# Patient Record
Sex: Female | Born: 1955
Health system: Southern US, Community
[De-identification: ages and names within clinical notes are randomized; demographics above are authoritative.]

## PROBLEM LIST (undated history)

## (undated) HISTORY — PX: OTHER SURGICAL HISTORY: SHX169

---

## 1977-03-28 HISTORY — PX: KNEE SURGERY: SHX244

## 1991-03-29 HISTORY — PX: NECK SURGERY: SHX720

## 1998-09-23 ENCOUNTER — Other Ambulatory Visit: Admission: RE | Admit: 1998-09-23 | Discharge: 1998-09-23 | Payer: Self-pay | Admitting: Obstetrics & Gynecology

## 1999-12-13 ENCOUNTER — Other Ambulatory Visit: Admission: RE | Admit: 1999-12-13 | Discharge: 1999-12-13 | Payer: Self-pay | Admitting: Obstetrics and Gynecology

## 2001-02-05 ENCOUNTER — Other Ambulatory Visit: Admission: RE | Admit: 2001-02-05 | Discharge: 2001-02-05 | Payer: Self-pay | Admitting: Obstetrics and Gynecology

## 2001-12-11 ENCOUNTER — Encounter: Payer: Self-pay | Admitting: Obstetrics and Gynecology

## 2001-12-11 ENCOUNTER — Encounter: Admission: RE | Admit: 2001-12-11 | Discharge: 2001-12-11 | Payer: Self-pay | Admitting: Obstetrics and Gynecology

## 2002-05-08 ENCOUNTER — Other Ambulatory Visit: Admission: RE | Admit: 2002-05-08 | Discharge: 2002-05-08 | Payer: Self-pay | Admitting: Obstetrics and Gynecology

## 2003-05-12 ENCOUNTER — Other Ambulatory Visit: Admission: RE | Admit: 2003-05-12 | Discharge: 2003-05-12 | Payer: Self-pay | Admitting: Obstetrics and Gynecology

## 2004-06-11 ENCOUNTER — Other Ambulatory Visit: Admission: RE | Admit: 2004-06-11 | Discharge: 2004-06-11 | Payer: Self-pay | Admitting: Obstetrics and Gynecology

## 2017-09-29 LAB — HM MAMMOGRAPHY

## 2017-09-29 LAB — HM PAP SMEAR: HM Pap smear: NEGATIVE

## 2018-05-25 ENCOUNTER — Other Ambulatory Visit (HOSPITAL_COMMUNITY): Payer: Self-pay | Admitting: Orthopedic Surgery

## 2018-05-25 ENCOUNTER — Other Ambulatory Visit: Payer: Self-pay | Admitting: Orthopedic Surgery

## 2018-05-25 DIAGNOSIS — M25532 Pain in left wrist: Secondary | ICD-10-CM

## 2018-06-08 ENCOUNTER — Ambulatory Visit (HOSPITAL_COMMUNITY)
Admission: RE | Admit: 2018-06-08 | Discharge: 2018-06-08 | Disposition: A | Discharge status: Home / Self Care | Payer: Commercial Managed Care - PPO | Source: Ambulatory Visit | Attending: Orthopedic Surgery | Admitting: Orthopedic Surgery

## 2018-06-08 ENCOUNTER — Other Ambulatory Visit: Payer: Self-pay

## 2018-06-08 DIAGNOSIS — M25532 Pain in left wrist: Secondary | ICD-10-CM

## 2018-06-08 MED ORDER — IOPAMIDOL (ISOVUE-M 200) INJECTION 41%
10.0000 mL | Freq: Once | INTRAMUSCULAR | Status: AC
  Administered 2018-06-08: 10 mL via INTRA_ARTICULAR

## 2018-06-08 MED ORDER — SODIUM CHLORIDE (PF) 0.9 % IJ SOLN
10.0000 mL | Freq: Once | INTRAMUSCULAR | Status: AC
  Administered 2018-06-08: 10 mL

## 2018-06-08 MED ORDER — LIDOCAINE HCL (PF) 1 % IJ SOLN
5.0000 mL | Freq: Once | INTRAMUSCULAR | Status: AC
  Administered 2018-06-08: 5 mL via INTRADERMAL

## 2018-06-08 MED ORDER — GADOBENATE DIMEGLUMINE 529 MG/ML IV SOLN
0.1000 mL | Freq: Once | INTRAVENOUS | Status: AC | PRN
  Administered 2018-06-08: 0.1 mL via INTRA_ARTICULAR

## 2018-07-27 HISTORY — PX: WRIST SURGERY: SHX841

## 2018-10-31 ENCOUNTER — Encounter: Payer: Self-pay | Admitting: Nurse Practitioner

## 2018-10-31 ENCOUNTER — Ambulatory Visit (INDEPENDENT_AMBULATORY_CARE_PROVIDER_SITE_OTHER): Payer: Commercial Managed Care - PPO | Admitting: Nurse Practitioner

## 2018-10-31 ENCOUNTER — Other Ambulatory Visit: Payer: Self-pay

## 2018-10-31 VITALS — BP 130/86 | HR 68 | Temp 98.4°F | Ht 67.25 in | Wt 185.8 lb

## 2018-10-31 DIAGNOSIS — Z13220 Encounter for screening for lipoid disorders: Secondary | ICD-10-CM

## 2018-10-31 DIAGNOSIS — Z8249 Family history of ischemic heart disease and other diseases of the circulatory system: Secondary | ICD-10-CM | POA: Insufficient documentation

## 2018-10-31 DIAGNOSIS — Z0000 Encounter for general adult medical examination without abnormal findings: Secondary | ICD-10-CM | POA: Diagnosis not present

## 2018-10-31 DIAGNOSIS — Z136 Encounter for screening for cardiovascular disorders: Secondary | ICD-10-CM | POA: Diagnosis not present

## 2018-10-31 LAB — COMPREHENSIVE METABOLIC PANEL
ALT: 15 U/L (ref 0–35)
AST: 15 U/L (ref 0–37)
Albumin: 4.4 g/dL (ref 3.5–5.2)
Alkaline Phosphatase: 87 U/L (ref 39–117)
BUN: 15 mg/dL (ref 6–23)
CO2: 29 mEq/L (ref 19–32)
Calcium: 9.9 mg/dL (ref 8.4–10.5)
Chloride: 107 mEq/L (ref 96–112)
Creatinine, Ser: 0.64 mg/dL (ref 0.40–1.20)
GFR: 93.55 mL/min (ref >60.00)
Glucose, Bld: 89 mg/dL (ref 70–99)
Potassium: 4.9 mEq/L (ref 3.5–5.1)
Sodium: 142 mEq/L (ref 135–145)
Total Bilirubin: 0.9 mg/dL (ref 0.2–1.2)
Total Protein: 7.2 g/dL (ref 6.0–8.3)

## 2018-10-31 LAB — LIPID PANEL
Cholesterol: 184 mg/dL (ref 0–200)
HDL: 64.4 mg/dL (ref >39.00)
LDL Cholesterol: 109 mg/dL — ABNORMAL HIGH (ref 0–99)
NonHDL: 119.51
Total CHOL/HDL Ratio: 3
Triglycerides: 52 mg/dL (ref 0.0–149.0)
VLDL: 10.4 mg/dL (ref 0.0–40.0)

## 2018-10-31 LAB — CBC WITH DIFFERENTIAL/PLATELET
Basophils Absolute: 0.1 10*3/uL (ref 0.0–0.1)
Basophils Relative: 1.3 % (ref 0.0–3.0)
Eosinophils Absolute: 0.1 10*3/uL (ref 0.0–0.7)
Eosinophils Relative: 1.5 % (ref 0.0–5.0)
HCT: 42.2 % (ref 36.0–46.0)
Hemoglobin: 14 g/dL (ref 12.0–15.0)
Lymphocytes Relative: 36.5 % (ref 12.0–46.0)
Lymphs Abs: 2.1 10*3/uL (ref 0.7–4.0)
MCHC: 33.3 g/dL (ref 30.0–36.0)
MCV: 95.3 fl (ref 78.0–100.0)
Monocytes Absolute: 0.5 10*3/uL (ref 0.1–1.0)
Monocytes Relative: 8.8 % (ref 3.0–12.0)
Neutro Abs: 3 10*3/uL (ref 1.4–7.7)
Neutrophils Relative %: 51.9 % (ref 43.0–77.0)
Platelets: 310 10*3/uL (ref 150.0–400.0)
RBC: 4.43 Mil/uL (ref 3.87–5.11)
RDW: 13 % (ref 11.5–15.5)
WBC: 5.7 10*3/uL (ref 4.0–10.5)

## 2018-10-31 LAB — TSH: TSH: 1.27 u[IU]/mL (ref 0.35–4.50)

## 2018-10-31 NOTE — Progress Notes (Signed)
Subjective:    Patient ID: Danielle Webster Webster, female    DOB: December 30, 1955, 63 y.o.   MRN: [id number]  Patient presents today for complete physical and establish care (new patient)  HPI  Denies any acute complaints.  Sexual History (orientation,birth control, marital status, STD):GYN: Dr. Runell Gess with [facility]. Last seen 2019 PAP: no  Hx of abnormal PAP per patient Mammogram: last 2019 normal per patient  Depression/Suicide: Depression screen [facility] 2/9 10/31/2018  Decreased Interest 0  Down, Depressed, Hopeless 0  PHQ - 2 Score 0   Vision:up to date  Dental: up to date   Immunizations: (TDAP, Hep C screen, Pneumovax, Influenza, zoster)  Health Maintenance  Topic Date Due  . Pap Smear  04/04/1976  . Mammogram  04/04/2005  . Colon Cancer Screening  04/04/2005  . Flu Shot  10/27/2018  . Tetanus Vaccine  10/31/2019*  .  Hepatitis C: One time screening is recommended by Center for Disease Control  (CDC) for  adults born from [birth year] through 1965.   10/31/2019*  . HIV Screening  10/31/2019*  *Topic was postponed. The date shown is not the original due date.   Diet:regular  Weight:  Wt Readings from Last 3 Encounters:  10/31/18 185 lb 12.8 oz (84.3 kg)   Exercise:none  Fall Risk: Fall Risk  10/31/2018  Falls in the past year? 0   Advanced Directive: Advanced Directives 10/31/2018  Does Patient Have a Medical Advance Directive? No  Would patient like information on creating a medical advance directive? Yes (MAU/Ambulatory/Procedural Areas - Information given)     Medications and allergies reviewed with patient and updated if appropriate.  Patient Active Problem List   Diagnosis Date Noted  . Family history of premature CAD 10/31/2018    No current outpatient medications on file prior to visit.   No current facility-administered medications on file prior to visit.     History reviewed. No pertinent past medical history.  Past Surgical History:  Procedure  Laterality Date  . KNEE SURGERY Right 1979  . [place]   ruptureed disc on neck  . WRIST SURGERY Left 07/2018    Social History   Socioeconomic History  . Marital status: Divorced    Spouse name: Not on file  . Number of children: Not on file  . Years of education: Not on file  . Highest education level: Not on file  Occupational History    Comment: Scientist, water quality  Social Needs  . Financial resource strain: Not on file  . Food insecurity    Worry: Not on file    Inability: Not on file  . Transportation needs    Medical: Not on file    Non-medical: Not on file  Tobacco Use  . Smoking status: Never Smoker  . Smokeless tobacco: Never Used  Substance and Sexual Activity  . Alcohol use: Yes    Comment: social  . Drug use: Never  . Sexual activity: Not Currently  Lifestyle  . Physical activity    Days per week: Not on file    Minutes per session: Not on file  . Stress: Not on file  Relationships  . Social Herbalist on phone: Not on file    Gets together: Not on file    Attends religious service: Not on file    Active member of club or organization: Not on file    Attends meetings of clubs or organizations: Not on file  Relationship status: Not on file  Other Topics Concern  . Not on file  Social History Narrative  . Not on file    Family History  Problem Relation Age of Onset  . Heart disease Mother 783       enlarged heart  . Cancer Father 6158       lung cancer secondary to tobacco use  . AAA (abdominal aortic aneurysm) Father   . Heart disease Father [id number]       CAD with CABG  . Diabetes Maternal Grandfather   . Cancer Paternal Grandfather        Review of Systems  Constitutional: Negative for fever, malaise/fatigue and weight loss.  HENT: Negative for congestion and sore throat.   Eyes:       Negative for visual changes  Respiratory: Negative for cough and shortness of breath.   Cardiovascular: Negative for chest pain, palpitations  and leg swelling.  Gastrointestinal: Negative for blood in stool, constipation, diarrhea and heartburn.  Genitourinary: Negative for dysuria, frequency and urgency.  Musculoskeletal: Negative for falls, joint pain and myalgias.  Skin: Negative for rash.  Neurological: Negative for dizziness, sensory change and headaches.  Endo/Heme/Allergies: Does not bruise/bleed easily.  Psychiatric/Behavioral: Negative for depression, substance abuse and suicidal ideas. The patient is not nervous/anxious.     Objective:   Vitals:   10/31/18 0841  BP: 130/86  Pulse: 68  Temp: 98.4 F (36.9 C)  SpO2: 98%    Body mass index is 28.88 kg/m.   Physical Examination:  Physical Exam Vitals signs reviewed.  Constitutional:      General: She is not in acute distress.    Appearance: She is well-developed.  HENT:     Head: Normocephalic.     Right Ear: Tympanic membrane, ear canal and external ear normal.     Left Ear: Tympanic membrane, ear canal and external ear normal.     Nose: Nose normal.     Mouth/Throat:     Pharynx: No oropharyngeal exudate.  Eyes:     Extraocular Movements: Extraocular movements intact.     Conjunctiva/sclera: Conjunctivae normal.  Cardiovascular:     Rate and Rhythm: Normal rate and regular rhythm.     Heart sounds: Normal heart sounds.  Pulmonary:     Effort: Pulmonary effort is normal. No respiratory distress.     Breath sounds: Normal breath sounds.  Chest:     Chest wall: No tenderness.  Abdominal:     General: Bowel sounds are normal.     Palpations: Abdomen is soft.  Genitourinary:    Comments: Deferred pelvic and breast exam to GYN Musculoskeletal: Normal range of motion.  Skin:    General: Skin is warm and dry.  Neurological:     Mental Status: She is alert and oriented to person, place, and time.     Motor: No weakness.     Gait: Gait normal.     Deep Tendon Reflexes: Reflexes are normal and symmetric.  Psychiatric:        Mood and Affect:  Mood normal.        Behavior: Behavior normal.    ASSESSMENT and PLAN:  Danielle Webster was seen today for establish care.  Diagnoses and all orders for this visit:  Preventative health care -     CBC with Differential/Platelet -     Comprehensive metabolic panel -     TSH -     Lipid panel  Encounter for lipid screening for cardiovascular disease -  Lipid panel   No problem-specific Assessment & Plan notes found for this encounter.     Problem List Items Addressed This Visit    None    Visit Diagnoses    Preventative health care    -  Primary   Relevant Orders   CBC with Differential/Platelet   Comprehensive metabolic panel   TSH   Lipid panel   Encounter for lipid screening for cardiovascular disease       Relevant Orders   Lipid panel       Follow up: Return if symptoms worsen or fail to improve.  Alysia Pennaharlotte Kalese Ensz, NP

## 2018-10-31 NOTE — Patient Instructions (Addendum)
Thank you for choosing [facility] for your health needs  Go to lab for blood draw.  We will request recent PAP and mammogram from Dr. Helane Rima  Let us know where last colonoscopy was done.  Health Maintenance, Female Adopting a healthy lifestyle and getting preventive care are important in promoting health and wellness. Ask your health care provider about:  The right schedule for you to have regular tests and exams.  Things you can do on your own to prevent diseases and keep yourself healthy. What should I know about diet, weight, and exercise? Eat a healthy diet   Eat a diet that includes plenty of vegetables, fruits, low-fat dairy products, and lean protein.  Do not eat a lot of foods that are high in solid fats, added sugars, or sodium. Maintain a healthy weight Body mass index (BMI) is used to identify weight problems. It estimates body fat based on height and weight. Your health care provider can help determine your BMI and help you achieve or maintain a healthy weight. Get regular exercise Get regular exercise. This is one of the most important things you can do for your health. Most adults should:  Exercise for at least 150 minutes each week. The exercise should increase your heart rate and make you sweat (moderate-intensity exercise).  Do strengthening exercises at least twice a week. This is in addition to the moderate-intensity exercise.  Spend less time sitting. Even light physical activity can be beneficial. Watch cholesterol and blood lipids Have your blood tested for lipids and cholesterol at 63 years of age, then have this test every 5 years. Have your cholesterol levels checked more often if:  Your lipid or cholesterol levels are high.  You are older than 63 years of age.  You are at high risk for heart disease. What should I know about cancer screening? Depending on your health history and family history, you may need to have cancer screening at various ages. This  may include screening for:  Breast cancer.  Cervical cancer.  Colorectal cancer.  Skin cancer.  Lung cancer. What should I know about heart disease, diabetes, and high blood pressure? Blood pressure and heart disease  High blood pressure causes heart disease and increases the risk of stroke. This is more likely to develop in people who have high blood pressure readings, are of African descent, or are overweight.  Have your blood pressure checked: ? Every 3-5 years if you are 99-49 years of age. ? Every year if you are 13 years old or older. Diabetes Have regular diabetes screenings. This checks your fasting blood sugar level. Have the screening done:  Once every three years after age 22 if you are at a normal weight and have a low risk for diabetes.  More often and at a younger age if you are overweight or have a high risk for diabetes. What should I know about preventing infection? Hepatitis B If you have a higher risk for hepatitis B, you should be screened for this virus. Talk with your health care provider to find out if you are at risk for hepatitis B infection. Hepatitis C Testing is recommended for:  Everyone born from [birth year] through 1965.  Anyone with known risk factors for hepatitis C. Sexually transmitted infections (STIs)  Get screened for STIs, including gonorrhea and chlamydia, if: ? You are sexually active and are younger than 63 years of age. ? You are older than 63 years of age and your health care provider tells you  that you are at risk for this type of infection. ? Your sexual activity has changed since you were last screened, and you are at increased risk for chlamydia or gonorrhea. Ask your health care provider if you are at risk.  Ask your health care provider about whether you are at high risk for HIV. Your health care provider may recommend a prescription medicine to help prevent HIV infection. If you choose to take medicine to prevent HIV, you should  first get tested for HIV. You should then be tested every 3 months for as long as you are taking the medicine. Pregnancy  If you are about to stop having your period (premenopausal) and you may become pregnant, seek counseling before you get pregnant.  Take 400 to 800 micrograms (mcg) of folic acid every day if you become pregnant.  Ask for birth control (contraception) if you want to prevent pregnancy. Osteoporosis and menopause Osteoporosis is a disease in which the bones lose minerals and strength with aging. This can result in bone fractures. If you are 21 years old or older, or if you are at risk for osteoporosis and fractures, ask your health care provider if you should:  Be screened for bone loss.  Take a calcium or vitamin D supplement to lower your risk of fractures.  Be given hormone replacement therapy (HRT) to treat symptoms of menopause. Follow these instructions at home: Lifestyle  Do not use any products that contain nicotine or tobacco, such as cigarettes, e-cigarettes, and chewing tobacco. If you need help quitting, ask your health care provider.  Do not use street drugs.  Do not share needles.  Ask your health care provider for help if you need support or information about quitting drugs. Alcohol use  Do not drink alcohol if: ? Your health care provider tells you not to drink. ? You are pregnant, may be pregnant, or are planning to become pregnant.  If you drink alcohol: ? Limit how much you use to 0-1 drink a day. ? Limit intake if you are breastfeeding.  Be aware of how much alcohol is in your drink. In the [place] one drink equals one 12 oz bottle of beer (355 mL), one 5 oz glass of wine (148 mL), or one 1 oz glass of hard liquor (44 mL). General instructions  Schedule regular health, dental, and eye exams.  Stay current with your vaccines.  Tell your health care provider if: ? You often feel depressed. ? You have ever been abused or do not feel safe  at home. Summary  Adopting a healthy lifestyle and getting preventive care are important in promoting health and wellness.  Follow your health care provider's instructions about healthy diet, exercising, and getting tested or screened for diseases.  Follow your health care provider's instructions on monitoring your cholesterol and blood pressure. This information is not intended to replace advice given to you by your health care provider. Make sure you discuss any questions you have with your health care provider. Document Released: 09/27/2010 Document Revised: 03/07/2018 Document Reviewed: 03/07/2018 Elsevier Patient Education  2020 [employer].

## 2018-11-14 ENCOUNTER — Encounter: Payer: Self-pay | Admitting: Nurse Practitioner

## 2018-11-14 NOTE — Progress Notes (Signed)
Abstracted result and sent to scan  

## 2019-05-01 ENCOUNTER — Other Ambulatory Visit (HOSPITAL_COMMUNITY): Payer: Self-pay | Admitting: Orthopedic Surgery

## 2019-05-01 ENCOUNTER — Other Ambulatory Visit: Payer: Self-pay | Admitting: Orthopedic Surgery

## 2019-05-01 DIAGNOSIS — M25561 Pain in right knee: Secondary | ICD-10-CM

## 2019-05-09 ENCOUNTER — Other Ambulatory Visit: Payer: Self-pay

## 2019-05-09 ENCOUNTER — Ambulatory Visit (HOSPITAL_COMMUNITY)
Admission: RE | Admit: 2019-05-09 | Discharge: 2019-05-09 | Disposition: A | Discharge status: Home / Self Care | Payer: Commercial Managed Care - PPO | Source: Ambulatory Visit | Attending: Orthopedic Surgery | Admitting: Orthopedic Surgery

## 2019-05-09 DIAGNOSIS — M25561 Pain in right knee: Secondary | ICD-10-CM | POA: Diagnosis present

## 2019-08-14 ENCOUNTER — Encounter: Payer: Self-pay | Admitting: Nurse Practitioner

## 2019-11-14 ENCOUNTER — Other Ambulatory Visit: Payer: Self-pay

## 2019-11-15 ENCOUNTER — Ambulatory Visit (INDEPENDENT_AMBULATORY_CARE_PROVIDER_SITE_OTHER): Payer: Commercial Managed Care - PPO | Admitting: Nurse Practitioner

## 2019-11-15 ENCOUNTER — Encounter: Payer: Self-pay | Admitting: Nurse Practitioner

## 2019-11-15 VITALS — BP 118/84 | HR 69 | Temp 97.8°F | Ht 65.75 in | Wt 178.4 lb

## 2019-11-15 DIAGNOSIS — Z1159 Encounter for screening for other viral diseases: Secondary | ICD-10-CM | POA: Diagnosis not present

## 2019-11-15 DIAGNOSIS — Z136 Encounter for screening for cardiovascular disorders: Secondary | ICD-10-CM

## 2019-11-15 DIAGNOSIS — Z01818 Encounter for other preprocedural examination: Secondary | ICD-10-CM

## 2019-11-15 DIAGNOSIS — Z13220 Encounter for screening for lipoid disorders: Secondary | ICD-10-CM

## 2019-11-15 DIAGNOSIS — Z0000 Encounter for general adult medical examination without abnormal findings: Secondary | ICD-10-CM

## 2019-11-15 LAB — COMPREHENSIVE METABOLIC PANEL
ALT: 13 U/L (ref 0–35)
AST: 14 U/L (ref 0–37)
Albumin: 4.2 g/dL (ref 3.5–5.2)
Alkaline Phosphatase: 97 U/L (ref 39–117)
BUN: 17 mg/dL (ref 6–23)
CO2: 28 mEq/L (ref 19–32)
Calcium: 9.4 mg/dL (ref 8.4–10.5)
Chloride: 107 mEq/L (ref 96–112)
Creatinine, Ser: 0.66 mg/dL (ref 0.40–1.20)
GFR: 89.99 mL/min (ref >60.00)
Glucose, Bld: 84 mg/dL (ref 70–99)
Potassium: 4.3 mEq/L (ref 3.5–5.1)
Sodium: 142 mEq/L (ref 135–145)
Total Bilirubin: 0.9 mg/dL (ref 0.2–1.2)
Total Protein: 6.6 g/dL (ref 6.0–8.3)

## 2019-11-15 LAB — CBC WITH DIFFERENTIAL/PLATELET
Basophils Absolute: 0.1 10*3/uL (ref 0.0–0.1)
Basophils Relative: 1 % (ref 0.0–3.0)
Eosinophils Absolute: 0.1 10*3/uL (ref 0.0–0.7)
Eosinophils Relative: 1.2 % (ref 0.0–5.0)
HCT: 41.3 % (ref 36.0–46.0)
Hemoglobin: 14 g/dL (ref 12.0–15.0)
Lymphocytes Relative: 34.6 % (ref 12.0–46.0)
Lymphs Abs: 2.1 10*3/uL (ref 0.7–4.0)
MCHC: 33.8 g/dL (ref 30.0–36.0)
MCV: 94.5 fl (ref 78.0–100.0)
Monocytes Absolute: 0.4 10*3/uL (ref 0.1–1.0)
Monocytes Relative: 7 % (ref 3.0–12.0)
Neutro Abs: 3.4 10*3/uL (ref 1.4–7.7)
Neutrophils Relative %: 56.2 % (ref 43.0–77.0)
Platelets: 275 10*3/uL (ref 150.0–400.0)
RBC: 4.38 Mil/uL (ref 3.87–5.11)
RDW: 13.1 % (ref 11.5–15.5)
WBC: 6 10*3/uL (ref 4.0–10.5)

## 2019-11-15 LAB — TSH: TSH: 1.18 u[IU]/mL (ref 0.35–4.50)

## 2019-11-15 LAB — LIPID PANEL
Cholesterol: 177 mg/dL (ref 0–200)
HDL: 58 mg/dL (ref >39.00)
LDL Cholesterol: 108 mg/dL — ABNORMAL HIGH (ref 0–99)
NonHDL: 119.46
Total CHOL/HDL Ratio: 3
Triglycerides: 55 mg/dL (ref 0.0–149.0)
VLDL: 11 mg/dL (ref 0.0–40.0)

## 2019-11-15 LAB — HEMOGLOBIN A1C: Hgb A1c MFr Bld: 5.6 % (ref 4.6–6.5)

## 2019-11-15 NOTE — Progress Notes (Signed)
Subjective:    Patient ID: Danielle Webster, female    DOB: 1955/11/30, 64 y.o.   MRN: [id number]  Patient presents today for CPE and preop clearance for right knee total arthroplasty.  HPI She does not have an exact date for surgery. She thinks it will be sometime in October,2021. She has had previous surgeries with use of general anesthesia. Denies any hx of complications. She has no hx of PE/DVT. No FHx of anesthesia complication or PE/DVT.  Sexual History (orientation,birth control, marital status, STD):single, not sexually active, pelvic and breast exam completed vy Dr. Vincente Poli per patient. Has upcoming mammogram 11/2019 per patient. Up to date with PAP.  Depression/Suicide: Depression screen [facility] 2/9 11/15/2019 10/31/2018  Decreased Interest 0 0  Down, Depressed, Hopeless 0 0  PHQ - 2 Score 0 0   Vision:up to date  Dental:up to date  Immunizations: (TDAP, Hep C screen, Pneumovax, Influenza, zoster)  Health Maintenance  Topic Date Due  .  Hepatitis C: One time screening is recommended by Center for Disease Control  (CDC) for  adults born from [birth year] through 1965.   Never done  . HIV Screening  Never done  . Tetanus Vaccine  Never done  . Mammogram  09/30/2019  . Flu Shot  10/27/2019  . Pap Smear  09/29/2020  . Colon Cancer Screening  11/23/2023  . COVID-19 Vaccine  Completed   Diet:regular. Exercise: limited due to right knee pain.  Weight:  Wt Readings from Last 3 Encounters:  11/15/19 178 lb 6.4 oz (80.9 kg)  10/31/18 185 lb 12.8 oz (84.3 kg)   Fall Risk: Fall Risk  10/31/2018  Falls in the past year? 0   Advanced Directive: Advanced Directives 10/31/2018  Does Patient Have a Medical Advance Directive? No  Would patient like information on creating a medical advance directive? Yes (MAU/Ambulatory/Procedural Areas - Information given)    Medications and allergies reviewed with patient and updated if appropriate.  Patient Active Problem List   Diagnosis Date Noted  .  Family history of premature CAD 10/31/2018   Current Outpatient Medications on File Prior to Visit  Medication Sig Dispense Refill  . meloxicam (MOBIC) 15 MG tablet Take 15 mg by mouth daily.     No current facility-administered medications on file prior to visit.   History reviewed. No pertinent past medical history.  Past Surgical History:  Procedure Laterality Date  . KNEE SURGERY Right 1979  . NECK SURGERY  1993   ruptureed disc on neck  . WRIST SURGERY Left 07/2018    Social History   Socioeconomic History  . Marital status: Divorced    Spouse name: Not on file  . Number of children: Not on file  . Years of education: Not on file  . Highest education level: Not on file  Occupational History    Comment: Leisure centre manager  Tobacco Use  . Smoking status: Never Smoker  . Smokeless tobacco: Never Used  Vaping Use  . Vaping Use: Never used  Substance and Sexual Activity  . Alcohol use: Yes    Comment: social  . Drug use: Never  . Sexual activity: Not Currently  Other Topics Concern  . Not on file  Social History Narrative  . Not on file   Social Determinants of Health   Financial Resource Strain:   . Difficulty of Paying Living Expenses: Not on file  Food Insecurity:   . Worried About Programme researcher, broadcasting/film/video in the Last Year: Not on file  .  Ran Out of Food in the Last Year: Not on file  Transportation Needs:   . Lack of Transportation (Medical): Not on file  . Lack of Transportation (Non-Medical): Not on file  Physical Activity:   . Days of Exercise per Week: Not on file  . Minutes of Exercise per Session: Not on file  Stress:   . Feeling of Stress : Not on file  Social Connections:   . Frequency of Communication with Friends and Family: Not on file  . Frequency of Social Gatherings with Friends and Family: Not on file  . Attends Religious Services: Not on file  . Active Member of Clubs or Organizations: Not on file  . Attends Banker Meetings: Not  on file  . Marital Status: Not on file   Family History  Problem Relation Age of Onset  . Heart disease Mother 9       enlarged heart  . Cancer Father 30       lung cancer secondary to tobacco use  . AAA (abdominal aortic aneurysm) Father   . Heart disease Father 85       CAD with CABG  . Diabetes Maternal Grandfather   . Cancer Paternal Grandfather        Review of Systems  Constitutional: Negative for fever, malaise/fatigue and weight loss.  HENT: Negative for congestion and sore throat.   Eyes:       Negative for visual changes  Respiratory: Negative for cough and shortness of breath.   Cardiovascular: Negative for chest pain, palpitations and leg swelling.  Gastrointestinal: Negative for blood in stool, constipation, diarrhea and heartburn.  Genitourinary: Negative for dysuria, frequency and urgency.  Musculoskeletal: Negative for falls, joint pain and myalgias.  Skin: Negative for rash.  Neurological: Negative for dizziness, sensory change and headaches.  Endo/Heme/Allergies: Does not bruise/bleed easily.  Psychiatric/Behavioral: Negative for depression, substance abuse and suicidal ideas. The patient is not nervous/anxious.    Objective:   Vitals:   11/15/19 0850  BP: 118/84  Pulse: 69  Temp: 97.8 F (36.6 C)  SpO2: 100%   Body mass index is 29.01 kg/m.  ECG: NSR with bifurcated P-wave and RBBB, no previous tracing to compare.  Physical Examination:  Physical Exam Vitals reviewed.  Constitutional:      General: She is not in acute distress.    Appearance: She is well-developed.  HENT:     Right Ear: Tympanic membrane, ear canal and external ear normal.     Left Ear: Tympanic membrane, ear canal and external ear normal.  Eyes:     Extraocular Movements: Extraocular movements intact.     Conjunctiva/sclera: Conjunctivae normal.  Cardiovascular:     Rate and Rhythm: Normal rate and regular rhythm.     Pulses: Normal pulses.     Heart sounds: Normal  heart sounds.  Pulmonary:     Effort: Pulmonary effort is normal. No respiratory distress.     Breath sounds: Normal breath sounds.  Chest:     Chest wall: No tenderness.  Abdominal:     General: Bowel sounds are normal. There is no distension.     Palpations: Abdomen is soft.     Tenderness: There is no abdominal tenderness.  Genitourinary:    Comments: Breast and pelvic exam deferred to GYN per patient Musculoskeletal:        General: Normal range of motion.     Cervical back: Normal range of motion and neck supple.     Right  lower leg: No edema.     Left lower leg: No edema.  Lymphadenopathy:     Cervical: No cervical adenopathy.  Skin:    General: Skin is warm and dry.     Findings: No erythema or rash.  Neurological:     Mental Status: She is alert and oriented to person, place, and time.     Deep Tendon Reflexes: Reflexes are normal and symmetric.  Psychiatric:        Mood and Affect: Mood normal.        Behavior: Behavior normal.        Thought Content: Thought content normal.    ASSESSMENT and PLAN: This visit occurred during the SARS-CoV-2 public health emergency.  Safety protocols were in place, including screening questions prior to the visit, additional usage of staff PPE, and extensive cleaning of exam room while observing appropriate contact time as indicated for disinfecting solutions.   Ndia was seen today for annual exam.  Diagnoses and all orders for this visit:  Preventative health care -     CBC with Differential/Platelet -     Comprehensive metabolic panel -     Lipid panel -     TSH  Preoperative clearance -     EKG 12-Lead -     Hemoglobin A1c  Encounter for lipid screening for cardiovascular disease -     Lipid panel  Encounter for hepatitis C screening test for low risk patient      Problem List Items Addressed This Visit    None    Visit Diagnoses    Preventative health care    -  Primary   Relevant Orders   CBC with  Differential/Platelet   Comprehensive metabolic panel   Lipid panel   TSH   Preoperative clearance       Relevant Orders   EKG 12-Lead   Hemoglobin A1c   Encounter for lipid screening for cardiovascular disease       Relevant Orders   Lipid panel   Encounter for hepatitis C screening test for low risk patient          Follow up: Return in about 1 year (around 11/14/2020) for CPE (fasting).  Alysia Penna, NP

## 2019-11-15 NOTE — Patient Instructions (Addendum)
Have preop form faxed to me Wish you the best with surgery and recovery.  Go to lab for blood draw. Have upcoming mammogram results faxed to me.  No acute finding on ECG.  Preventive Care 84-64 Years Old, Female Preventive care refers to visits with your health care provider and lifestyle choices that can promote health and wellness. This includes:  A yearly physical exam. This may also be called an annual well check.  Regular dental visits and eye exams.  Immunizations.  Screening for certain conditions.  Healthy lifestyle choices, such as eating a healthy diet, getting regular exercise, not using drugs or products that contain nicotine and tobacco, and limiting alcohol use. What can I expect for my preventive care visit? Physical exam Your health care provider will check your:  Height and weight. This may be used to calculate body mass index (BMI), which tells if you are at a healthy weight.  Heart rate and blood pressure.  Skin for abnormal spots. Counseling Your health care provider may ask you questions about your:  Alcohol, tobacco, and drug use.  Emotional well-being.  Home and relationship well-being.  Sexual activity.  Eating habits.  Work and work Statistician.  Method of birth control.  Menstrual cycle.  Pregnancy history. What immunizations do I need?  Influenza (flu) vaccine  This is recommended every year. Tetanus, diphtheria, and pertussis (Tdap) vaccine  You may need a Td booster every 10 years. Varicella (chickenpox) vaccine  You may need this if you have not been vaccinated. Zoster (shingles) vaccine  You may need this after age 20. Measles, mumps, and rubella (MMR) vaccine  You may need at least one dose of MMR if you were born in 1957 or later. You may also need a second dose. Pneumococcal conjugate (PCV13) vaccine  You may need this if you have certain conditions and were not previously vaccinated. Pneumococcal polysaccharide  (PPSV23) vaccine  You may need one or two doses if you smoke cigarettes or if you have certain conditions. Meningococcal conjugate (MenACWY) vaccine  You may need this if you have certain conditions. Hepatitis A vaccine  You may need this if you have certain conditions or if you travel or work in places where you may be exposed to hepatitis A. Hepatitis B vaccine  You may need this if you have certain conditions or if you travel or work in places where you may be exposed to hepatitis B. Haemophilus influenzae type b (Hib) vaccine  You may need this if you have certain conditions. Human papillomavirus (HPV) vaccine  If recommended by your health care provider, you may need three doses over 6 months. You may receive vaccines as individual doses or as more than one vaccine together in one shot (combination vaccines). Talk with your health care provider about the risks and benefits of combination vaccines. What tests do I need? Blood tests  Lipid and cholesterol levels. These may be checked every 5 years, or more frequently if you are over 12 years old.  Hepatitis C test.  Hepatitis B test. Screening  Lung cancer screening. You may have this screening every year starting at age 40 if you have a 30-pack-year history of smoking and currently smoke or have quit within the past 15 years.  Colorectal cancer screening. All adults should have this screening starting at age 80 and continuing until age 59. Your health care provider may recommend screening at age 67 if you are at increased risk. You will have tests every 1-10 years,  depending on your results and the type of screening test.  Diabetes screening. This is done by checking your blood sugar (glucose) after you have not eaten for a while (fasting). You may have this done every 1-3 years.  Mammogram. This may be done every 1-2 years. Talk with your health care provider about when you should start having regular mammograms. This may  depend on whether you have a family history of breast cancer.  BRCA-related cancer screening. This may be done if you have a family history of breast, ovarian, tubal, or peritoneal cancers.  Pelvic exam and Pap test. This may be done every 3 years starting at age 20. Starting at age 57, this may be done every 5 years if you have a Pap test in combination with an HPV test. Other tests  Sexually transmitted disease (STD) testing.  Bone density scan. This is done to screen for osteoporosis. You may have this scan if you are at high risk for osteoporosis. Follow these instructions at home: Eating and drinking  Eat a diet that includes fresh fruits and vegetables, whole grains, lean protein, and low-fat dairy.  Take vitamin and mineral supplements as recommended by your health care provider.  Do not drink alcohol if: ? Your health care provider tells you not to drink. ? You are pregnant, may be pregnant, or are planning to become pregnant.  If you drink alcohol: ? Limit how much you have to 0-1 drink a day. ? Be aware of how much alcohol is in your drink. In the [place] one drink equals one 12 oz bottle of beer (355 mL), one 5 oz glass of wine (148 mL), or one 1 oz glass of hard liquor (44 mL). Lifestyle  Take daily care of your teeth and gums.  Stay active. Exercise for at least 30 minutes on 5 or more days each week.  Do not use any products that contain nicotine or tobacco, such as cigarettes, e-cigarettes, and chewing tobacco. If you need help quitting, ask your health care provider.  If you are sexually active, practice safe sex. Use a condom or other form of birth control (contraception) in order to prevent pregnancy and STIs (sexually transmitted infections).  If told by your health care provider, take low-dose aspirin daily starting at age 50. What's next?  Visit your health care provider once a year for a well check visit.  Ask your health care provider how often you should  have your eyes and teeth checked.  Stay up to date on all vaccines. This information is not intended to replace advice given to you by your health care provider. Make sure you discuss any questions you have with your health care provider. Document Revised: 11/23/2017 Document Reviewed: 11/23/2017 Elsevier Patient Education  2020 [employer].

## 2019-12-11 ENCOUNTER — Telehealth: Payer: Self-pay | Admitting: Nurse Practitioner

## 2019-12-11 NOTE — Telephone Encounter (Signed)
Forms placed in folder on [place] desk for review.

## 2019-12-11 NOTE — Telephone Encounter (Signed)
Notes faxed over to [facility].

## 2019-12-11 NOTE — Telephone Encounter (Signed)
Rebecaa is calling and wanted to see if patients recent labs and office notes be faxed to [phone], please advise. CB is [phone]

## 2019-12-11 NOTE — Telephone Encounter (Signed)
Patient dropped off forms to be completed by her provider. Placed forms in providers folder for pick up.

## 2019-12-12 LAB — HM MAMMOGRAPHY

## 2019-12-16 ENCOUNTER — Telehealth: Payer: Self-pay | Admitting: Nurse Practitioner

## 2019-12-16 NOTE — Telephone Encounter (Signed)
Unable to leave a message, phone just rings.

## 2019-12-16 NOTE — Telephone Encounter (Signed)
Danielle Webster is calling and wanted to see what recent labs patients has received, please advise. CB is [phone]

## 2019-12-18 ENCOUNTER — Telehealth: Payer: Self-pay | Admitting: Nurse Practitioner

## 2019-12-18 DIAGNOSIS — Z01818 Encounter for other preprocedural examination: Secondary | ICD-10-CM

## 2019-12-18 NOTE — Telephone Encounter (Signed)
Patient called back to check status of orders for PT INR. She will be out of town next week and so needs this done this week.

## 2019-12-18 NOTE — Telephone Encounter (Signed)
Please advise 

## 2019-12-18 NOTE — Telephone Encounter (Signed)
Ok to enter order

## 2019-12-18 NOTE — Telephone Encounter (Signed)
Patient is calling and wanted to see if Danielle Webster could put in orders to get PT INR labs checked for knee surgery, please advise. CB is [phone]

## 2019-12-19 ENCOUNTER — Other Ambulatory Visit: Payer: Self-pay

## 2019-12-19 ENCOUNTER — Other Ambulatory Visit (INDEPENDENT_AMBULATORY_CARE_PROVIDER_SITE_OTHER): Payer: Commercial Managed Care - PPO

## 2019-12-19 ENCOUNTER — Ambulatory Visit (INDEPENDENT_AMBULATORY_CARE_PROVIDER_SITE_OTHER): Payer: Commercial Managed Care - PPO

## 2019-12-19 DIAGNOSIS — Z01818 Encounter for other preprocedural examination: Secondary | ICD-10-CM

## 2019-12-19 DIAGNOSIS — Z01812 Encounter for preprocedural laboratory examination: Secondary | ICD-10-CM | POA: Diagnosis not present

## 2019-12-19 LAB — POCT INR: INR: 0.9 — AB (ref 2.0–3.0)

## 2019-12-19 LAB — PROTIME-INR
INR: 0.9 ratio (ref 0.8–1.0)
Prothrombin Time: 10.5 s (ref 9.6–13.1)

## 2019-12-19 NOTE — Addendum Note (Signed)
Addended by: Varney Biles on: 12/19/2019 01:26 PM   Modules accepted: Orders

## 2019-12-19 NOTE — Progress Notes (Signed)
Error. Patient was ordered PT/INR not a POCT INR. No billing required for this encounter.

## 2019-12-19 NOTE — Progress Notes (Signed)
Pt here for pre-op INR per [facility], AGNP.   Goal INR = TBD by surgeon  Pt denies recent antibiotics, no dietary changes and no unusual bruising / bleeding.  INR today = 0.9  Pt advised to await instructions from PCP and/or surgeon.

## 2019-12-21 ENCOUNTER — Encounter: Payer: Self-pay | Admitting: Nurse Practitioner

## 2019-12-23 ENCOUNTER — Telehealth: Payer: Self-pay | Admitting: Nurse Practitioner

## 2019-12-23 NOTE — Telephone Encounter (Signed)
Results have been faxed

## 2019-12-23 NOTE — Telephone Encounter (Signed)
Danielle Webster is calling and wanted to see if patients Ronnald Nian can be faxed to  [phone] for surgery. CB is [phone]

## 2020-01-03 HISTORY — PX: JOINT REPLACEMENT: SHX530

## 2020-07-22 IMAGING — MR MR KNEE*R* W/O CM
7 series · 38 of 40 positions shown · non-contrast
Comparison: None.

CLINICAL DATA: Chronic right knee pain.

EXAM:
MRI OF THE RIGHT KNEE WITHOUT CONTRAST
TECHNIQUE: Multiplanar, multisequence MR imaging of the knee was performed. No
intravenous contrast was administered.

[Series 5: PD fat-sat · sagittal · right · 4.0mm · 0.47mm/px · 6 of 22 slices shown (1 of 2)]
[im 1/22]
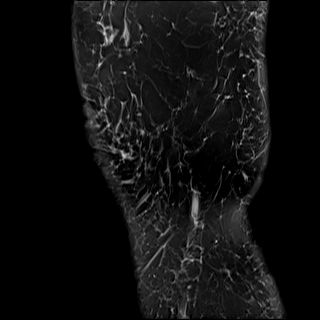
[im 5/22]
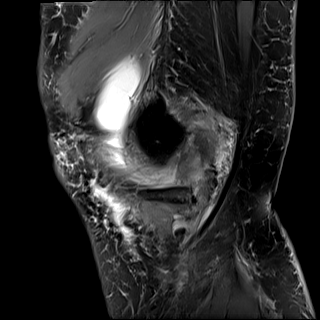
[im 9/22]
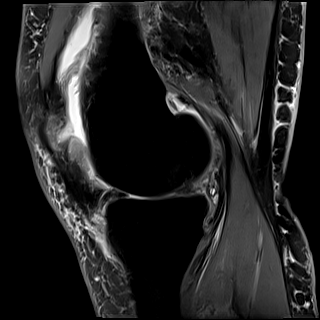
[im 13/22]
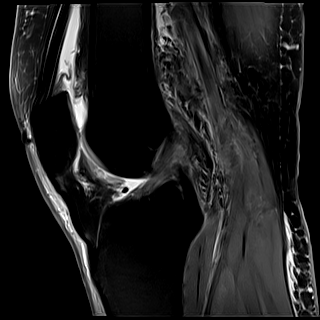
[im 17/22]
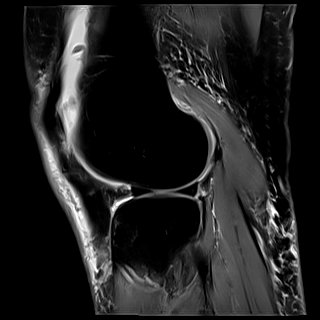
[im 22/22]
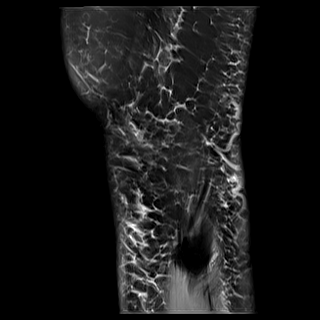

[Series 6: T2 fat-sat · sagittal · right · 4.0mm · 0.47mm/px · 5 of 22 slices shown (1 of 3)]
[im 1/22]
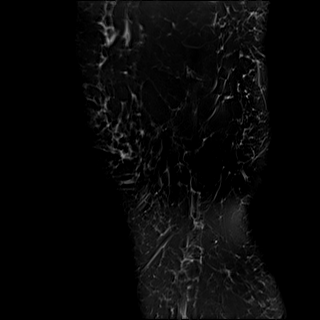
[im 6/22]
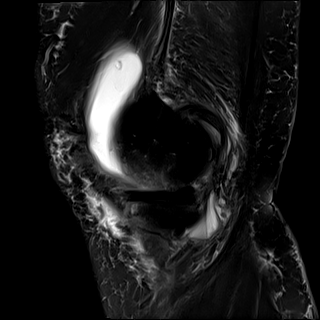
[im 11/22]
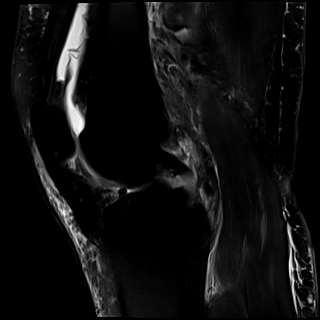
[im 16/22]
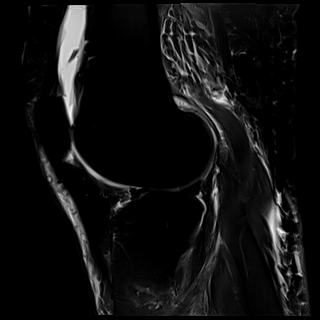
[im 22/22]
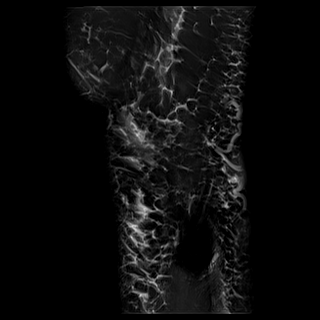

[Series 7: T2 fat-sat · axial · right · 4.0mm · 0.50mm/px · z∈[-74,+66]mm · 8 of 33 slices shown (2 of 3)]
[im 1/33]
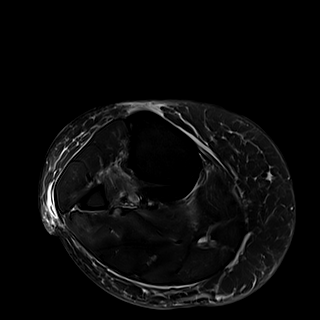
[im 5/33]
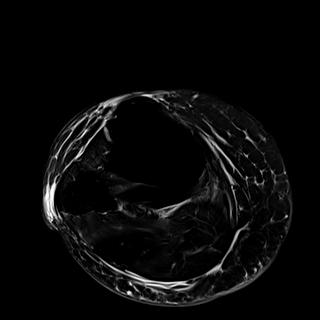
[im 10/33]
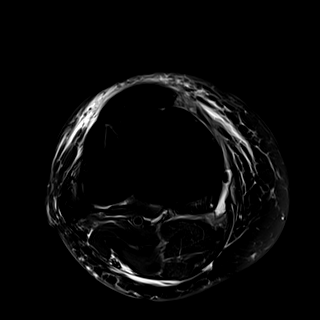
[im 14/33]
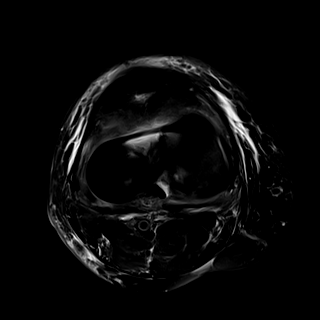
[im 19/33]
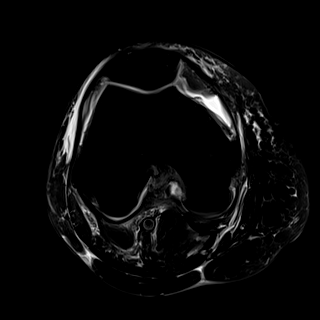
[im 23/33]
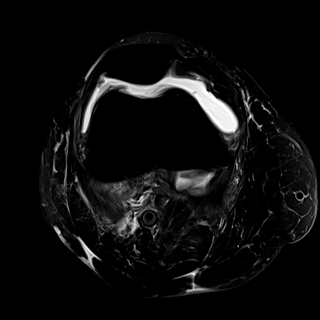
[im 28/33]
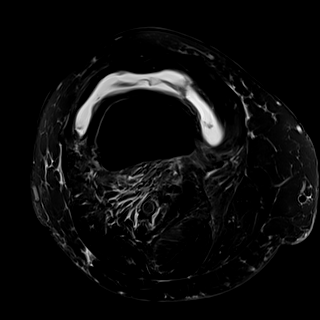
[im 33/33]
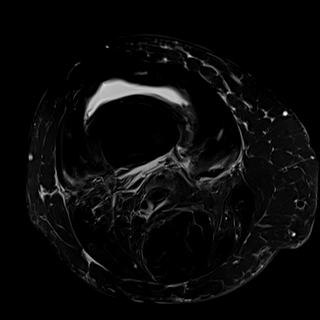

[Series 8: T1 · coronal · right · 4.0mm · 0.29mm/px · 6 of 25 slices shown]
[im 1/25]
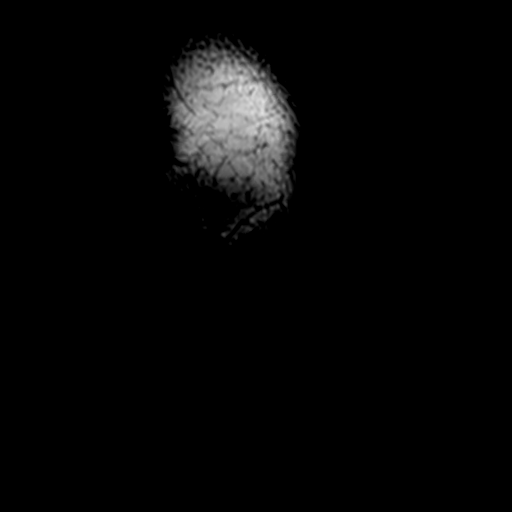
[im 5/25]
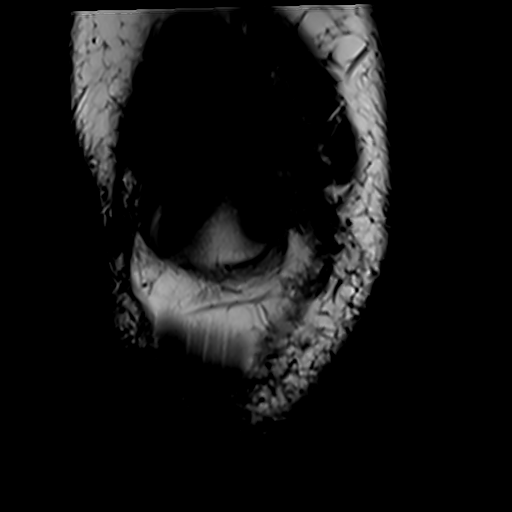
[im 10/25]
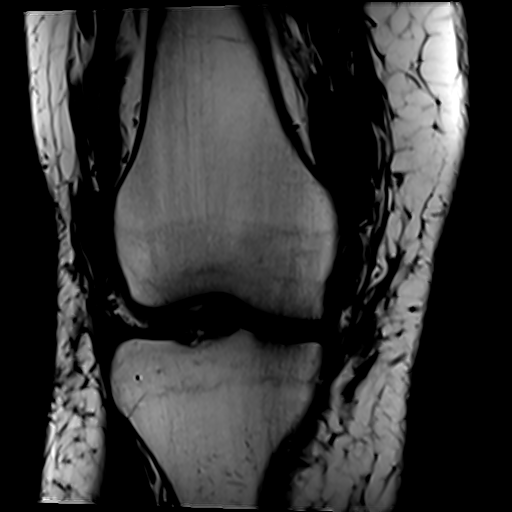
[im 15/25]
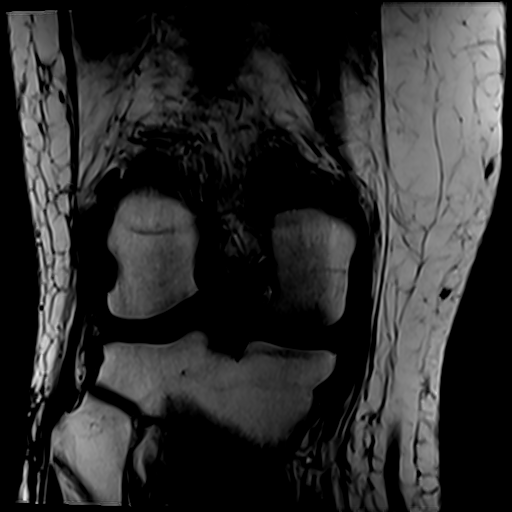
[im 20/25]
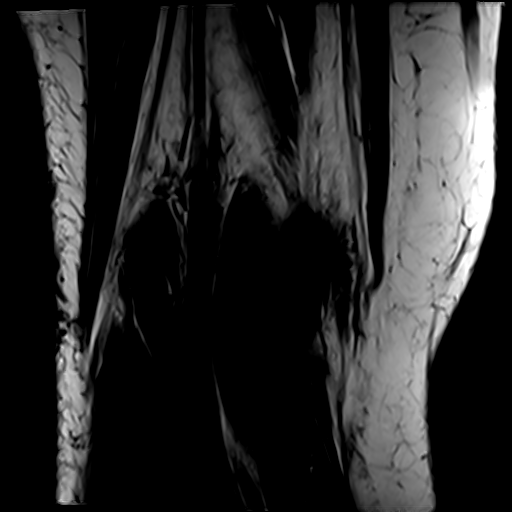
[im 25/25]
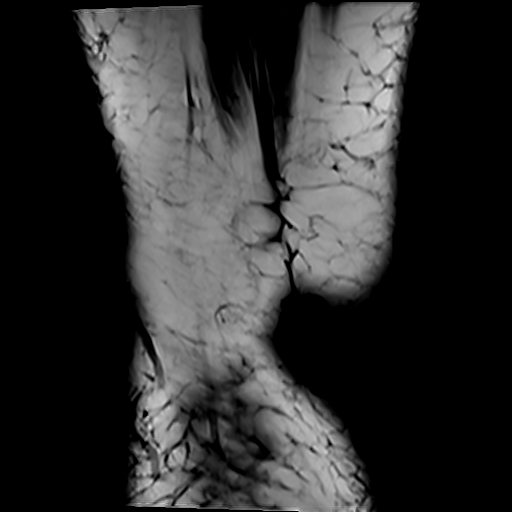

[Series 9: T2 fat-sat · coronal · right · 4.0mm · 0.59mm/px · 6 of 25 slices shown (3 of 3)]
[im 1/25]
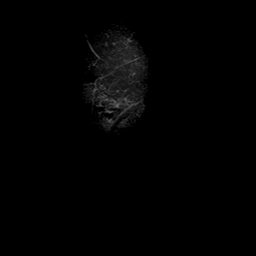
[im 5/25]
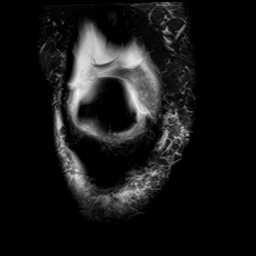
[im 10/25]
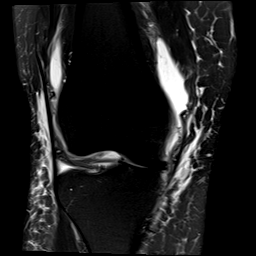
[im 15/25]
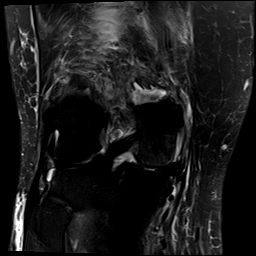
[im 20/25]
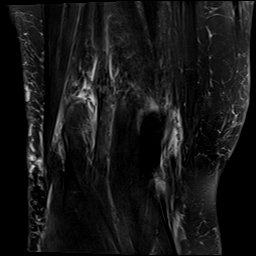
[im 25/25]
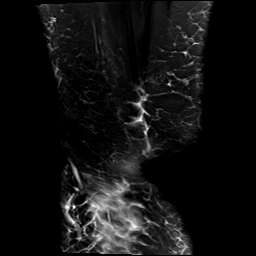

[Series 10: PD fat-sat · coronal · right · 4.0mm · 0.47mm/px · 6 of 25 slices shown (2 of 2)]
[im 1/25]
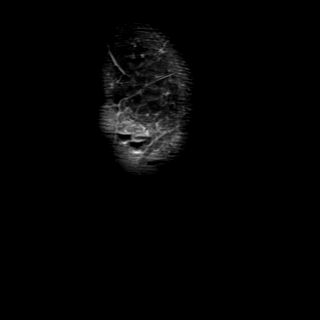
[im 5/25]
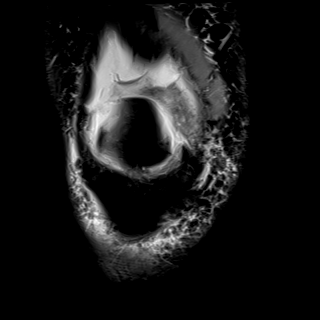
[im 10/25]
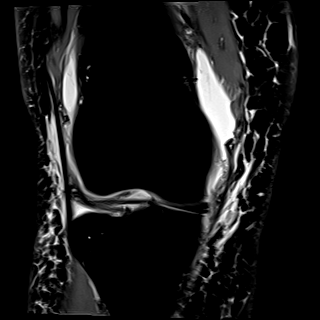
[im 15/25]
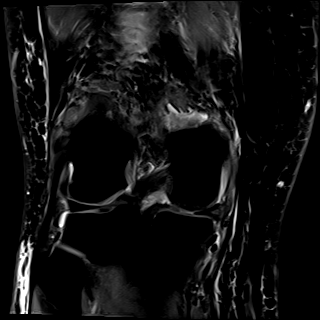
[im 20/25]
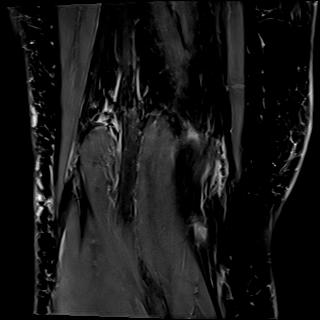
[im 25/25]
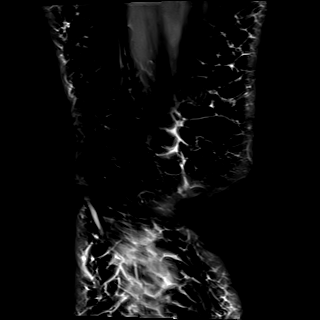

[Series 11: acl cor thin · coronal · right · 3.0mm · 0.47mm/px · 1 of 11 slices shown]
[im 1/11]
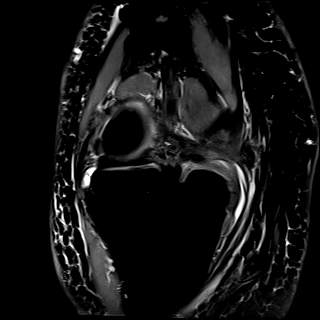

[38 of 40 positions shown; findings below may reference images not displayed]

FINDINGS: MENISCI

Medial meniscus: Diffusely degenerated with complex tear of the
posterior horn (series 5, image 8). Borderline extrusion of the
body.

Lateral meniscus:  Intact.

LIGAMENTS

Cruciates:  Intact ACL and PCL.

Collaterals: Medial collateral ligament is intact. Lateral
collateral ligament complex is intact.

CARTILAGE

Patellofemoral:  Mild partial-thickness cartilage loss.

Medial: Diffuse cartilage thinning with focal full-thickness
cartilage loss over the posterior weight-bearing medial femoral
condyle and underlying subchondral marrow edema.

Lateral: Diffuse cartilage thinning over the lateral femoral condyle
without focal defect.

Joint: Moderate joint effusion. Thickened suprapatellar plica.
Minimal edema within central Hoffa's fat.

Popliteal Fossa:  No Baker cyst. Intact popliteus tendon.

Extensor Mechanism: Intact quadriceps tendon and patellar tendon.
Intact medial and lateral patellar retinaculum. Intact MPFL.

Bones:  No acute fracture or dislocation. No suspicious bone lesion.

Other: Small amount of fluid in the semimembranosus-tibial
collateral ligament bursa. Mild diffuse soft tissue swelling about
the knee.
IMPRESSION: 1. Complex tear of the medial meniscus posterior horn. Borderline
extrusion of the body.
2. Mild tricompartmental osteoarthritis.
3. Moderate joint effusion.  Thickened suprapatellar plica.
4. Mild semimembranosus-tibial collateral ligament bursitis.

## 2021-01-07 LAB — HM MAMMOGRAPHY: HM Mammogram: NORMAL (ref 0–4)

## 2021-01-07 LAB — HM DEXA SCAN

## 2021-01-07 LAB — HM PAP SMEAR: HM Pap smear: NORMAL

## 2021-01-07 LAB — RESULTS CONSOLE HPV: CHL HPV: NEGATIVE

## 2021-04-20 LAB — LIPID PANEL
Cholesterol: 178 (ref 0–200)
HDL: 73 — AB (ref 35–70)
LDL Cholesterol: 88
Triglycerides: 85 (ref 40–160)

## 2021-04-20 LAB — BASIC METABOLIC PANEL: Glucose: 86

## 2021-04-29 ENCOUNTER — Ambulatory Visit (INDEPENDENT_AMBULATORY_CARE_PROVIDER_SITE_OTHER): Payer: Commercial Managed Care - PPO | Admitting: Nurse Practitioner

## 2021-04-29 ENCOUNTER — Other Ambulatory Visit: Payer: Self-pay

## 2021-04-29 ENCOUNTER — Encounter: Payer: Self-pay | Admitting: Nurse Practitioner

## 2021-04-29 VITALS — BP 132/90 | HR 85 | Temp 96.4°F | Ht 65.75 in | Wt 198.0 lb

## 2021-04-29 DIAGNOSIS — R61 Generalized hyperhidrosis: Secondary | ICD-10-CM

## 2021-04-29 DIAGNOSIS — N644 Mastodynia: Secondary | ICD-10-CM | POA: Diagnosis not present

## 2021-04-29 DIAGNOSIS — R030 Elevated blood-pressure reading, without diagnosis of hypertension: Secondary | ICD-10-CM | POA: Diagnosis not present

## 2021-04-29 LAB — CBC WITH DIFFERENTIAL/PLATELET
Basophils Absolute: 0.1 10*3/uL (ref 0.0–0.1)
Basophils Relative: 1 % (ref 0.0–3.0)
Eosinophils Absolute: 0.1 10*3/uL (ref 0.0–0.7)
Eosinophils Relative: 1.2 % (ref 0.0–5.0)
HCT: 41.3 % (ref 36.0–46.0)
Hemoglobin: 13.7 g/dL (ref 12.0–15.0)
Lymphocytes Relative: 37.8 % (ref 12.0–46.0)
Lymphs Abs: 2.3 10*3/uL (ref 0.7–4.0)
MCHC: 33.3 g/dL (ref 30.0–36.0)
MCV: 95 fl (ref 78.0–100.0)
Monocytes Absolute: 0.4 10*3/uL (ref 0.1–1.0)
Monocytes Relative: 7.4 % (ref 3.0–12.0)
Neutro Abs: 3.1 10*3/uL (ref 1.4–7.7)
Neutrophils Relative %: 52.6 % (ref 43.0–77.0)
Platelets: 275 10*3/uL (ref 150.0–400.0)
RBC: 4.34 Mil/uL (ref 3.87–5.11)
RDW: 13 % (ref 11.5–15.5)
WBC: 6 10*3/uL (ref 4.0–10.5)

## 2021-04-29 LAB — COMPREHENSIVE METABOLIC PANEL
ALT: 17 U/L (ref 0–35)
AST: 19 U/L (ref 0–37)
Albumin: 4.2 g/dL (ref 3.5–5.2)
Alkaline Phosphatase: 88 U/L (ref 39–117)
BUN: 13 mg/dL (ref 6–23)
CO2: 28 mEq/L (ref 19–32)
Calcium: 9.6 mg/dL (ref 8.4–10.5)
Chloride: 106 mEq/L (ref 96–112)
Creatinine, Ser: 0.65 mg/dL (ref 0.40–1.20)
GFR: 91.97 mL/min (ref >60.00)
Glucose, Bld: 95 mg/dL (ref 70–99)
Potassium: 3.9 mEq/L (ref 3.5–5.1)
Sodium: 141 mEq/L (ref 135–145)
Total Bilirubin: 0.7 mg/dL (ref 0.2–1.2)
Total Protein: 7.2 g/dL (ref 6.0–8.3)

## 2021-04-29 NOTE — Assessment & Plan Note (Signed)
BP elevated a few weeks ago at work biometric screening, 164/94. BP today on recheck was 132/90. Encouraged her to limit the amount of salt in her diet and to start checking her blood pressure at home and writing it down. Call if the number is consistently >140/90. Check CMP, CBC today. Follow up in 3 months.

## 2021-04-29 NOTE — Assessment & Plan Note (Addendum)
Acute breast/side pain radiating to axilla with diaphoresis. Pain has not occurred since two episodes 3 weeks ago. EKG done today shows normal sinus rhythm, heart rate 62, no ST or T wave changes. Will check CMP, CBC. Last mammogram in October 2022 was normal. Follow up if this pain occurs again.

## 2021-04-29 NOTE — Progress Notes (Signed)
Acute Office Visit  Subjective:    Patient ID: Danielle Webster, female    DOB: May 11, 1955, 66 y.o.   MRN: [id number]  Chief Complaint  Patient presents with   Breast Pain    Pt c/o sever sharp pain in right breast that shoots to armpit x3 weeks ago. Pt states she has not experience any symptoms since then.     HPI Patient is in today for breast pain in right breast/side that woke her up in the middle of the night 3 weeks ago. She states it was very severe, sharp, and went to her axilla. She endorses diaphoresis. It went away on it's own after a little bit of time (she doesn't remember how long).   The next morning, she went outside with her dogs, and the sharp pain and diaphoresis happened again, and felt like she was going to pass out. She went inside and layed down. She doesn't remember if she was short of breath. Denies nausea, vomiting. The pain has not occurred since then.   Of note, she recently had biometric screening done at work, and her blood pressure was elevated 170/100, and 161/94 on 04/20/21. She has not checked it since and she does not have a blood pressure cuff at home.   History reviewed. No pertinent past medical history.  Past Surgical History:  Procedure Laterality Date   JOINT REPLACEMENT Right 01/03/2020   KNEE SURGERY Right [place]   ruptureed disc on neck   WRIST SURGERY Left 07/2018    Family History  Problem Relation Age of Onset   Heart disease Mother 42       enlarged heart   Cancer Father 2       lung cancer secondary to tobacco use   AAA (abdominal aortic aneurysm) Father    Heart disease Father 16       CAD with CABG   Diabetes Maternal Grandfather    Cancer Paternal Grandfather     Social History   Socioeconomic History   Marital status: Divorced    Spouse name: Not on file   Number of children: Not on file   Years of education: Not on file   Highest education level: Not on file  Occupational History    Comment:  Scientist, water quality  Tobacco Use   Smoking status: Never   Smokeless tobacco: Never  Vaping Use   Vaping Use: Never used  Substance and Sexual Activity   Alcohol use: Yes    Comment: social   Drug use: Never   Sexual activity: Not Currently  Other Topics Concern   Not on file  Social History Narrative   Not on file   Social Determinants of Health   Financial Resource Strain: Not on file  Food Insecurity: Not on file  Transportation Needs: Not on file  Physical Activity: Not on file  Stress: Not on file  Social Connections: Not on file  Intimate Partner Violence: Not on file    Outpatient Medications Prior to Visit  Medication Sig Dispense Refill   meloxicam (MOBIC) 15 MG tablet Take 15 mg by mouth daily.     No facility-administered medications prior to visit.    No Known Allergies  Review of Systems See pertinent positives and negatives per HPI.    Objective:    Physical Exam Vitals and nursing note reviewed.  Constitutional:      General: She is not in acute distress.    Appearance: Normal  appearance.  HENT:     Head: Normocephalic.  Eyes:     Conjunctiva/sclera: Conjunctivae normal.  Cardiovascular:     Rate and Rhythm: Normal rate and regular rhythm.     Pulses: Normal pulses.     Heart sounds: Normal heart sounds.  Pulmonary:     Effort: Pulmonary effort is normal.     Breath sounds: Normal breath sounds.  Musculoskeletal:        General: No tenderness. Normal range of motion.     Cervical back: Normal range of motion.  Skin:    General: Skin is warm.  Neurological:     General: No focal deficit present.     Mental Status: She is alert and oriented to person, place, and time.  Psychiatric:        Mood and Affect: Mood normal.        Behavior: Behavior normal.        Thought Content: Thought content normal.        Judgment: Judgment normal.    BP 132/90 (BP Location: Right Arm, Cuff Size: Large)    Pulse 85    Temp (!) 96.4 F (35.8 C) (Temporal)     Ht 5' 5.75" (1.67 m)    Wt 198 lb (89.8 kg)    SpO2 98%    BMI 32.20 kg/m  Wt Readings from Last 3 Encounters:  04/29/21 198 lb (89.8 kg)  11/15/19 178 lb 6.4 oz (80.9 kg)  10/31/18 185 lb 12.8 oz (84.3 kg)    Health Maintenance Due  Topic Date Due   Hepatitis C Screening  Never done   TETANUS/TDAP  Never done   Zoster Vaccines- Shingrix (1 of 2) Never done   COVID-19 Vaccine (3 - Booster for Pfizer series) 08/27/2019   Pneumonia Vaccine 69+ Years old (1 - PCV) Never done   INFLUENZA VACCINE  10/26/2020    There are no preventive care reminders to display for this patient.   Lab Results  Component Value Date   TSH 1.18 11/15/2019   Lab Results  Component Value Date   WBC 6.0 11/15/2019   HGB 14.0 11/15/2019   HCT 41.3 11/15/2019   MCV 94.5 11/15/2019   PLT 275.0 11/15/2019   Lab Results  Component Value Date   NA 142 11/15/2019   K 4.3 11/15/2019   CO2 28 11/15/2019   GLUCOSE 84 11/15/2019   BUN 17 11/15/2019   CREATININE 0.66 11/15/2019   BILITOT 0.9 11/15/2019   ALKPHOS 97 11/15/2019   AST 14 11/15/2019   ALT 13 11/15/2019   PROT 6.6 11/15/2019   ALBUMIN 4.2 11/15/2019   CALCIUM 9.4 11/15/2019   GFR 89.99 11/15/2019   Lab Results  Component Value Date   CHOL 178 04/20/2021   Lab Results  Component Value Date   HDL 73 (A) 04/20/2021   Lab Results  Component Value Date   LDLCALC 88 04/20/2021   Lab Results  Component Value Date   TRIG 85 04/20/2021   Lab Results  Component Value Date   CHOLHDL 3 11/15/2019   Lab Results  Component Value Date   HGBA1C 5.6 11/15/2019       Assessment & Plan:   Problem List Items Addressed This Visit       Other   Elevated blood pressure reading    BP elevated a few weeks ago at work biometric screening, 164/94. BP today on recheck was 132/90. Encouraged her to limit the amount of salt in her  diet and to start checking her blood pressure at home and writing it down. Call if the number is consistently  >140/90. Check CMP, CBC today. Follow up in 3 months.       Breast pain, right - Primary    Acute breast/side pain radiating to axilla with diaphoresis. Pain has not occurred since two episodes 3 weeks ago. EKG done today shows normal sinus rhythm, heart rate 62, no ST or T wave changes. Will check CMP, CBC. Last mammogram in October 2022 was normal. Follow up if this pain occurs again.       Relevant Orders   EKG 12-Lead (Completed)   CBC with Differential/Platelet   Comprehensive metabolic panel   RESOLVED: Diaphoresis   Relevant Orders   EKG 12-Lead (Completed)   CBC with Differential/Platelet   Comprehensive metabolic panel    No orders of the defined types were placed in this encounter.   Charyl Dancer, NP

## 2021-04-29 NOTE — Patient Instructions (Signed)
It was great to see you!  Start checking your blood pressure daily and call if it is greater than 140/90 (either top or bottom number) for several days in a row.   Your EKG was reassuring in the office today.   Let's follow-up in 3 months, sooner if you have concerns.  If a referral was placed today, you will be contacted for an appointment. Please note that routine referrals can sometimes take up to 3-4 weeks to process. Please call our office if you haven't heard anything after this time frame.  Take care,  Rodman Pickle, NP

## 2021-04-29 NOTE — Progress Notes (Signed)
EKG interpreted by me on 04/29/21 showed normal sinus rhythm, heart rate 62 with no ST or T wave changes.

## 2021-05-04 ENCOUNTER — Encounter: Payer: Self-pay | Admitting: Nurse Practitioner

## 2021-07-27 NOTE — Progress Notes (Signed)
? ?Established Patient Office Visit ? ?Subjective   ?Patient ID: Danielle Webster, female    DOB: [birth year]  Age: 66 y.o. MRN: [id number] ? ?Chief Complaint  ?Patient presents with  ? Hypertension  ?  3 mo f/u elevated BP. Pt is fasting.  164/93  ? ? ?HPI ? ?Danielle Webster is here today to follow-up on an elevated blood pressure reading. She has been checking her blood pressure daily and has ranged from 120s-160s/80s-90s with average being 140s/90. She denies chest pain, shortness of breath, headaches, and leg swelling.  ? ?She states that her weight has been going up recently no matter how little or how much she eats. She feels like this could be contributing to her high blood pressure. She has been eating grilled meats, vegetables, and trying to decrease her sweets. She has not been exercising as much, because after she gets home from work, she is too tired to walk. ? ?History reviewed. No pertinent past medical history. ?Past Surgical History:  ?Procedure Laterality Date  ? JOINT REPLACEMENT Right 01/03/2020  ? KNEE SURGERY Right 1979  ? NECK SURGERY  1993  ? ruptureed disc on neck  ? WRIST SURGERY Left 07/2018  ? ?ROS ?See pertinent positives and negatives per HPI. ? ?  ?Objective:  ?  ? ?BP (!) 160/94 (BP Location: Left Arm, Cuff Size: Normal)   Pulse 70   Temp (!) 97.1 ?F (36.2 ?C) (Temporal)   Wt 200 lb 12.8 oz (91.1 kg)   SpO2 94%   BMI 32.66 kg/m?  ?BP Readings from Last 3 Encounters:  ?07/28/21 (!) 160/94  ?04/29/21 132/90  ?11/15/19 118/84  ? ? ?Physical Exam ?Vitals and nursing note reviewed.  ?Constitutional:   ?   General: She is not in acute distress. ?   Appearance: Normal appearance.  ?HENT:  ?   Head: Normocephalic.  ?Eyes:  ?   Conjunctiva/sclera: Conjunctivae normal.  ?Cardiovascular:  ?   Rate and Rhythm: Normal rate and regular rhythm.  ?   Pulses: Normal pulses.  ?   Heart sounds: Normal heart sounds.  ?Pulmonary:  ?   Effort: Pulmonary effort is normal.  ?   Breath sounds: Normal breath sounds.   ?Musculoskeletal:  ?   Cervical back: Normal range of motion.  ?Skin: ?   General: Skin is warm.  ?Neurological:  ?   General: No focal deficit present.  ?   Mental Status: She is alert and oriented to person, place, and time.  ?Psychiatric:     ?   Mood and Affect: Mood normal.     ?   Behavior: Behavior normal.     ?   Thought Content: Thought content normal.     ?   Judgment: Judgment normal.  ? ? ? ?No results found for any visits on 07/28/21. ? ?Last CBC ?Lab Results  ?Component Value Date  ? WBC 6.0 04/29/2021  ? HGB 13.7 04/29/2021  ? HCT 41.3 04/29/2021  ? MCV 95.0 04/29/2021  ? RDW 13.0 04/29/2021  ? PLT 275.0 04/29/2021  ? ?Last metabolic panel ?Lab Results  ?Component Value Date  ? GLUCOSE 95 04/29/2021  ? NA 141 04/29/2021  ? K 3.9 04/29/2021  ? CL 106 04/29/2021  ? CO2 28 04/29/2021  ? BUN 13 04/29/2021  ? CREATININE 0.65 04/29/2021  ? CALCIUM 9.6 04/29/2021  ? PROT 7.2 04/29/2021  ? ALBUMIN 4.2 04/29/2021  ? BILITOT 0.7 04/29/2021  ? ALKPHOS 88 04/29/2021  ? AST 19 04/29/2021  ?  ALT 17 04/29/2021  ? ?Last lipids ?Lab Results  ?Component Value Date  ? CHOL 178 04/20/2021  ? HDL 73 (A) 04/20/2021  ? LDLCALC 88 04/20/2021  ? TRIG 85 04/20/2021  ? CHOLHDL 3 11/15/2019  ? ?  ? ?The 10-year ASCVD risk score (Arnett DK, et al., 2019) is: 10.8%* (Cholesterol units were assumed) ? ?  ?Assessment & Plan:  ? ?Problem List Items Addressed This Visit   ? ?  ? Cardiovascular and Mediastinum  ? Primary hypertension - Primary  ?  For the past 3 months she has been checking her blood pressure at home daily. It ranges from 120s-160s/80s-90s with the average readings in the 140s/90. Her BP today is 160/94. She has been limiting sodium in her diet. Will start lisinopril 5mg  daily. Side effects discussed. Follow up in 4 weeks.  ? ?  ?  ? Relevant Medications  ? lisinopril (ZESTRIL) 5 MG tablet  ?  ? Other  ? Obesity (BMI 30-39.9)  ?  BMI 32.6. Discussed healthy eating and exercise. Did also discuss referral to  nutritionist which she declined and would like to look up more information on weight loss medications. Goal is to lose 1-2 pounds per week. ? ?  ?  ? ? ?Return in about 4 weeks (around 08/25/2021) for HTN.  ? ? ?08/27/2021, NP ? ?

## 2021-07-28 ENCOUNTER — Ambulatory Visit (INDEPENDENT_AMBULATORY_CARE_PROVIDER_SITE_OTHER): Payer: Commercial Managed Care - PPO | Admitting: Nurse Practitioner

## 2021-07-28 ENCOUNTER — Encounter: Payer: Self-pay | Admitting: Nurse Practitioner

## 2021-07-28 VITALS — BP 160/94 | HR 70 | Temp 97.1°F | Wt 200.8 lb

## 2021-07-28 DIAGNOSIS — E669 Obesity, unspecified: Secondary | ICD-10-CM | POA: Diagnosis not present

## 2021-07-28 DIAGNOSIS — I10 Essential (primary) hypertension: Secondary | ICD-10-CM | POA: Diagnosis not present

## 2021-07-28 MED ORDER — LISINOPRIL 5 MG PO TABS: 5.0000 mg | ORAL_TABLET | Freq: Every day | ORAL | 0 refills | Status: DC

## 2021-07-28 NOTE — Assessment & Plan Note (Signed)
BMI 32.6. Discussed healthy eating and exercise. Did also discuss referral to nutritionist which she declined and would like to look up more information on weight loss medications. Goal is to lose 1-2 pounds per week. ?

## 2021-07-28 NOTE — Patient Instructions (Signed)
It was great to see you! ? ?Start lisinopril 5 mg (1 tablet) daily.  ? ?I recommend you call your insurance company, the number should be on the back of your card, and ask if any weight loss medications are covered. Make sure when talking to them, you specify this is for weight loss. Here is a list of medications:  ? ?Contrave - pill ?Semaglutide (Ozempic) - Injection ?Semaglutide [facility]) - Injection ?Liraglutide (Saxenda) - Injection ?Liraglutide (Victoza) - Injection  ? ?I also recommend continued focus on diet and nutrition along with exercise. You can use free apps like "My Fitness Pal" or "Lose It" on your smart phone to track your food.  ? ? ?Let's follow-up in 4 weeks, sooner if you have concerns. ? ?If a referral was placed today, you will be contacted for an appointment. Please note that routine referrals can sometimes take up to 3-4 weeks to process. Please call our office if you haven't heard anything after this time frame. ? ?Take care, ? ?Rodman Pickle, NP ? ?

## 2021-07-28 NOTE — Assessment & Plan Note (Addendum)
For the past 3 months she has been checking her blood pressure at home daily. It ranges from 120s-160s/80s-90s with the average readings in the 140s/90. Her BP today is 160/94. She has been limiting sodium in her diet. Will start lisinopril 5mg  daily. Side effects discussed. Follow up in 4 weeks.  ?

## 2021-08-26 ENCOUNTER — Ambulatory Visit (INDEPENDENT_AMBULATORY_CARE_PROVIDER_SITE_OTHER): Payer: Commercial Managed Care - PPO | Admitting: Nurse Practitioner

## 2021-08-26 ENCOUNTER — Encounter: Payer: Self-pay | Admitting: Nurse Practitioner

## 2021-08-26 VITALS — BP 134/84 | HR 78 | Temp 97.1°F | Wt 199.4 lb

## 2021-08-26 DIAGNOSIS — I10 Essential (primary) hypertension: Secondary | ICD-10-CM

## 2021-08-26 LAB — BASIC METABOLIC PANEL
BUN: 15 mg/dL (ref 6–23)
CO2: 25 mEq/L (ref 19–32)
Calcium: 9.2 mg/dL (ref 8.4–10.5)
Chloride: 106 mEq/L (ref 96–112)
Creatinine, Ser: 0.68 mg/dL (ref 0.40–1.20)
GFR: 90.77 mL/min (ref >60.00)
Glucose, Bld: 92 mg/dL (ref 70–99)
Potassium: 4.1 mEq/L (ref 3.5–5.1)
Sodium: 140 mEq/L (ref 135–145)

## 2021-08-26 MED ORDER — LISINOPRIL 5 MG PO TABS: 5.0000 mg | ORAL_TABLET | Freq: Every day | ORAL | 1 refills | Status: DC

## 2021-08-26 NOTE — Patient Instructions (Signed)
It was great to see you!  Continue lisinopril 5mg  daily. I have sent a refill. We are going to check your kidneys today  Let's follow-up in 6 months, sooner if you have concerns.  If a referral was placed today, you will be contacted for an appointment. Please note that routine referrals can sometimes take up to 3-4 weeks to process. Please call our office if you haven't heard anything after this time frame.  Take care,  , NP

## 2021-08-26 NOTE — Progress Notes (Signed)
   Established Patient Office Visit  Subjective   Patient ID: Danielle Webster, female    DOB: 05/08/1955  Age: 66 y.o. MRN: [id number]  Chief Complaint  Patient presents with   Follow-up    4 week follow up on HTN. Patient is fasting.     HPI  Danielle Webster is here today to follow-up on hypertension. Last visit she was started on lisinopril 5 mg daily. She has been tolerating the medication well. She says that intermittently she will have a slight headache, but she takes tylenol and it goes away. She has been checking her blood pressure and it is ranging 110s-120s/70s-80s. She denies chest pain and shortness of breath.   History reviewed. No pertinent past medical history. Past Surgical History:  Procedure Laterality Date   JOINT REPLACEMENT Right 01/03/2020   KNEE SURGERY Right 1979   NECK SURGERY  1993   ruptureed disc on neck   WRIST SURGERY Left 07/2018   ROS See pertinent positives and negatives per HPI.    Objective:     BP 134/84 (BP Location: Left Arm, Patient Position: Sitting, Cuff Size: Large)   Pulse 78   Temp (!) 97.1 F (36.2 C) (Temporal)   Wt 199 lb 6.4 oz (90.4 kg)   SpO2 98%   BMI 32.43 kg/m  BP Readings from Last 3 Encounters:  08/26/21 134/84  07/28/21 (!) 160/94  04/29/21 132/90    Physical Exam Vitals and nursing note reviewed.  Constitutional:      General: She is not in acute distress.    Appearance: Normal appearance.  HENT:     Head: Normocephalic.  Eyes:     Conjunctiva/sclera: Conjunctivae normal.  Cardiovascular:     Rate and Rhythm: Normal rate and regular rhythm.     Pulses: Normal pulses.     Heart sounds: Normal heart sounds.  Pulmonary:     Effort: Pulmonary effort is normal.     Breath sounds: Normal breath sounds.  Musculoskeletal:     Cervical back: Normal range of motion.  Skin:    General: Skin is warm.  Neurological:     General: No focal deficit present.     Mental Status: She is alert and oriented to person, place,  and time.  Psychiatric:        Mood and Affect: Mood normal.        Behavior: Behavior normal.        Thought Content: Thought content normal.        Judgment: Judgment normal.    The 10-year ASCVD risk score (Arnett DK, et al., 2019) is: 7.7%* (Cholesterol units were assumed)    Assessment & Plan:   Problem List Items Addressed This Visit       Cardiovascular and Mediastinum   Primary hypertension - Primary    Blood pressure is improved and is stable since last visit.  She is tolerating the lisinopril 5 mg daily which is a slight headache every once in a while.  She takes Tylenol and the headache is away.  She is okay to continue this medication, refill sent to the pharmacy.  We will check BMP since starting medication.  Follow-up in 6 months       Relevant Medications   lisinopril (ZESTRIL) 5 MG tablet   Other Relevant Orders   Basic metabolic panel    Return in about 6 months (around 02/25/2022) for CPE.    Gerre Scull, NP

## 2021-08-26 NOTE — Assessment & Plan Note (Signed)
Blood pressure is improved and is stable since last visit.  She is tolerating the lisinopril 5 mg daily which is a slight headache every once in a while.  She takes Tylenol and the headache is away.  She is okay to continue this medication, refill sent to the pharmacy.  We will check BMP since starting medication.  Follow-up in 6 months

## 2021-09-28 ENCOUNTER — Encounter: Payer: Self-pay | Admitting: Nurse Practitioner

## 2021-12-16 ENCOUNTER — Encounter: Payer: Self-pay | Admitting: Nurse Practitioner

## 2021-12-17 NOTE — Telephone Encounter (Signed)
Called and explained to pt she will need an appointment before her pcp can complete pre-op forms. Pt stated she loses her insurance on 01/07/22 and needs the surgery before then and she is currently not in town. Per pcp, patient will still need an appt. Sw, cma

## 2021-12-29 ENCOUNTER — Encounter: Payer: Self-pay | Admitting: Nurse Practitioner

## 2021-12-29 ENCOUNTER — Ambulatory Visit (INDEPENDENT_AMBULATORY_CARE_PROVIDER_SITE_OTHER): Payer: Commercial Managed Care - PPO | Admitting: Nurse Practitioner

## 2021-12-29 VITALS — BP 112/82 | HR 85 | Temp 96.5°F | Ht 65.7 in | Wt 200.2 lb

## 2021-12-29 DIAGNOSIS — I10 Essential (primary) hypertension: Secondary | ICD-10-CM | POA: Diagnosis not present

## 2021-12-29 DIAGNOSIS — Z01818 Encounter for other preprocedural examination: Secondary | ICD-10-CM | POA: Diagnosis not present

## 2021-12-29 NOTE — Progress Notes (Signed)
   Established Patient Office Visit  Subjective   Patient ID: Danielle Webster, female    DOB: 1955/11/29  Age: 66 y.o. MRN: [id number]  Chief Complaint  Patient presents with   Pre-op Exam    Pt is here for pre-op clearance    HPI  Danielle Webster is here to follow-up on hypertension and for preop clearance for left knee replacement.   She has not been checking her blood pressure at home.  She denies chest pain, shortness of breath, headaches.  She is taking lisinopril 5mg  daily.  She is planning on having a left knee replacement due to ongoing pain.  She is currently following with orthopedics and needs preoperative clearance.  She denies chest pain, shortness of breath.  She had a prior right knee replacement and did well with this.  She does not have any concerns today.  She is taking tramadol as needed for pain.    ROS See pertinent positives and negatives per HPI.    Objective:     BP 112/82 (BP Location: Right Arm, Cuff Size: Normal)   Pulse 85   Temp (!) 96.5 F (35.8 C) (Temporal)   Ht 5' 5.7" (1.669 m)   Wt 200 lb 3.2 oz (90.8 kg)   SpO2 96%   BMI 32.61 kg/m    Physical Exam Vitals and nursing note reviewed.  Constitutional:      General: She is not in acute distress.    Appearance: Normal appearance.  HENT:     Head: Normocephalic.  Eyes:     Conjunctiva/sclera: Conjunctivae normal.  Cardiovascular:     Rate and Rhythm: Normal rate and regular rhythm.     Pulses: Normal pulses.     Heart sounds: Normal heart sounds.  Pulmonary:     Effort: Pulmonary effort is normal.     Breath sounds: Normal breath sounds.  Musculoskeletal:     Cervical back: Normal range of motion.  Skin:    General: Skin is warm.  Neurological:     General: No focal deficit present.     Mental Status: She is alert and oriented to person, place, and time.  Psychiatric:        Mood and Affect: Mood normal.        Behavior: Behavior normal.        Thought Content: Thought  content normal.        Judgment: Judgment normal.    The 10-year ASCVD risk score (Arnett DK, et al., 2019) is: 5.4%* (Cholesterol units were assumed)    Assessment & Plan:   Problem List Items Addressed This Visit       Cardiovascular and Mediastinum   Primary hypertension    Chronic, stable.  BP today 112/82.  Continue lisinopril 5 mg daily.  Check CMP, CBC.  Follow-up in 6 months.      Relevant Orders   CBC   Comprehensive metabolic panel     Other   Preoperative clearance - Primary    She is planning to have a knee replacement for ongoing arthritis in her left knee.  EKG done today shows normal sinus rhythm.  She does not have a history of heart disease, diabetes.  She is low risk for surgery.  We will fax forms over to orthopedics.      Relevant Orders   EKG 12-Lead (Completed)    Return in about 6 months (around 06/30/2022) for HTN.    Charyl Dancer, NP

## 2021-12-29 NOTE — Progress Notes (Signed)
EKG interpreted by me on 12/29/21 showed normal sinus rhythm with a heart rate of 75.

## 2021-12-29 NOTE — Assessment & Plan Note (Signed)
Chronic, stable.  BP today 112/82.  Continue lisinopril 5 mg daily.  Check CMP, CBC.  Follow-up in 6 months.

## 2021-12-29 NOTE — Assessment & Plan Note (Signed)
She is planning to have a knee replacement for ongoing arthritis in her left knee.  EKG done today shows normal sinus rhythm.  She does not have a history of heart disease, diabetes.  She is low risk for surgery.  We will fax forms over to orthopedics.

## 2021-12-29 NOTE — Patient Instructions (Signed)
It was great to see you!  We are checking your labs today and will let you know the results via mychart/phone.   I am faxing over the surgical clearance form tomorrow morning.   Let's follow-up in 6 months, sooner if you have concerns.  If a referral was placed today, you will be contacted for an appointment. Please note that routine referrals can sometimes take up to 3-4 weeks to process. Please call our office if you haven't heard anything after this time frame.  Take care,  Vance Peper, NP

## 2021-12-30 LAB — COMPREHENSIVE METABOLIC PANEL
ALT: 16 U/L (ref 0–35)
AST: 17 U/L (ref 0–37)
Albumin: 4.6 g/dL (ref 3.5–5.2)
Alkaline Phosphatase: 101 U/L (ref 39–117)
BUN: 24 mg/dL — ABNORMAL HIGH (ref 6–23)
CO2: 29 mEq/L (ref 19–32)
Calcium: 10.1 mg/dL (ref 8.4–10.5)
Chloride: 101 mEq/L (ref 96–112)
Creatinine, Ser: 0.75 mg/dL (ref 0.40–1.20)
GFR: 82.78 mL/min (ref >60.00)
Glucose, Bld: 89 mg/dL (ref 70–99)
Potassium: 4.2 mEq/L (ref 3.5–5.1)
Sodium: 138 mEq/L (ref 135–145)
Total Bilirubin: 0.8 mg/dL (ref 0.2–1.2)
Total Protein: 7.4 g/dL (ref 6.0–8.3)

## 2021-12-30 LAB — CBC
HCT: 41 % (ref 36.0–46.0)
Hemoglobin: 13.7 g/dL (ref 12.0–15.0)
MCHC: 33.4 g/dL (ref 30.0–36.0)
MCV: 95.8 fl (ref 78.0–100.0)
Platelets: 356 10*3/uL (ref 150.0–400.0)
RBC: 4.28 Mil/uL (ref 3.87–5.11)
RDW: 13.1 % (ref 11.5–15.5)
WBC: 9 10*3/uL (ref 4.0–10.5)

## 2022-01-04 LAB — HM MAMMOGRAPHY: HM Mammogram: NORMAL (ref 0–4)

## 2022-02-09 ENCOUNTER — Other Ambulatory Visit: Payer: Self-pay | Admitting: Nurse Practitioner

## 2022-03-01 ENCOUNTER — Ambulatory Visit: Payer: Commercial Managed Care - PPO | Admitting: Nurse Practitioner

## 2022-06-30 ENCOUNTER — Ambulatory Visit: Payer: Medicare Other | Admitting: Nurse Practitioner

## 2022-07-18 ENCOUNTER — Ambulatory Visit: Payer: Medicare Other | Admitting: Nurse Practitioner

## 2022-07-21 ENCOUNTER — Encounter: Payer: Self-pay | Admitting: Nurse Practitioner

## 2022-07-21 ENCOUNTER — Ambulatory Visit (INDEPENDENT_AMBULATORY_CARE_PROVIDER_SITE_OTHER): Payer: Medicare Other | Admitting: Nurse Practitioner

## 2022-07-21 VITALS — BP 130/88 | HR 80 | Temp 97.0°F | Ht 67.0 in | Wt 200.0 lb

## 2022-07-21 DIAGNOSIS — Z23 Encounter for immunization: Secondary | ICD-10-CM | POA: Diagnosis not present

## 2022-07-21 DIAGNOSIS — S41112A Laceration without foreign body of left upper arm, initial encounter: Secondary | ICD-10-CM | POA: Diagnosis not present

## 2022-07-21 DIAGNOSIS — I10 Essential (primary) hypertension: Secondary | ICD-10-CM | POA: Diagnosis not present

## 2022-07-21 LAB — LIPID PANEL
Cholesterol: 168 mg/dL (ref 0–200)
HDL: 58.3 mg/dL (ref >39.00)
LDL Cholesterol: 98 mg/dL (ref 0–99)
NonHDL: 109.52
Total CHOL/HDL Ratio: 3
Triglycerides: 58 mg/dL (ref 0.0–149.0)
VLDL: 11.6 mg/dL (ref 0.0–40.0)

## 2022-07-21 LAB — CBC
HCT: 42.5 % (ref 36.0–46.0)
Hemoglobin: 14.2 g/dL (ref 12.0–15.0)
MCHC: 33.5 g/dL (ref 30.0–36.0)
MCV: 94.7 fl (ref 78.0–100.0)
Platelets: 303 10*3/uL (ref 150.0–400.0)
RBC: 4.48 Mil/uL (ref 3.87–5.11)
RDW: 13.8 % (ref 11.5–15.5)
WBC: 5.4 10*3/uL (ref 4.0–10.5)

## 2022-07-21 LAB — COMPREHENSIVE METABOLIC PANEL
ALT: 19 U/L (ref 0–35)
AST: 18 U/L (ref 0–37)
Albumin: 4.3 g/dL (ref 3.5–5.2)
Alkaline Phosphatase: 95 U/L (ref 39–117)
BUN: 13 mg/dL (ref 6–23)
CO2: 27 mEq/L (ref 19–32)
Calcium: 9.8 mg/dL (ref 8.4–10.5)
Chloride: 103 mEq/L (ref 96–112)
Creatinine, Ser: 0.73 mg/dL (ref 0.40–1.20)
GFR: 85.17 mL/min (ref >60.00)
Glucose, Bld: 90 mg/dL (ref 70–99)
Potassium: 4.9 mEq/L (ref 3.5–5.1)
Sodium: 138 mEq/L (ref 135–145)
Total Bilirubin: 1.2 mg/dL (ref 0.2–1.2)
Total Protein: 7.1 g/dL (ref 6.0–8.3)

## 2022-07-21 MED ORDER — LISINOPRIL 5 MG PO TABS: 5.0000 mg | ORAL_TABLET | Freq: Every day | ORAL | 3 refills | Status: DC

## 2022-07-21 NOTE — Progress Notes (Signed)
Established Patient Office Visit  Subjective   Patient ID: Danielle Webster, female    DOB: February 28, 1956  Age: 67 y.o. MRN: [id number]  Chief Complaint  Patient presents with   Hypertension    With refill, no concerns    HPI  Danielle Webster is here to follow-up on hypertension.   HYPERTENSION  Hypertension status: controlled  Satisfied with current treatment? yes Duration of hypertension: chronic BP monitoring frequency:  not checking BP range: n/a BP medication side effects:  no Medication compliance: excellent compliance Previous BP meds: lisinopril Aspirin: no Recurrent headaches: no Visual changes: no Palpitations: no Dyspnea: no Chest pain: no Lower extremity edema: no Dizzy/lightheaded: no    ROS See pertinent positives and negatives per HPI.    Objective:     BP 130/88 (BP Location: Right Arm)   Pulse 80   Temp (!) 97 F (36.1 C)   Ht  (1.702 m)   Wt 200 lb (90.7 kg)   SpO2 98%   BMI 31.32 kg/m    Physical Exam Vitals and nursing note reviewed.  Constitutional:      General: She is not in acute distress.    Appearance: Normal appearance.  HENT:     Head: Normocephalic.  Eyes:     Conjunctiva/sclera: Conjunctivae normal.  Cardiovascular:     Rate and Rhythm: Normal rate and regular rhythm.     Pulses: Normal pulses.     Heart sounds: Normal heart sounds.  Pulmonary:     Effort: Pulmonary effort is normal.     Breath sounds: Normal breath sounds.  Musculoskeletal:     Cervical back: Normal range of motion.  Skin:    General: Skin is warm.     Comments: Laceration to left forearm, no signs of infection  Neurological:     General: No focal deficit present.     Mental Status: She is alert and oriented to person, place, and time.  Psychiatric:        Mood and Affect: Mood normal.        Behavior: Behavior normal.        Thought Content: Thought content normal.        Judgment: Judgment normal.      Results for orders placed  or performed in visit on 07/21/22  HM MAMMOGRAPHY  Result Value Ref Range   HM Mammogram Self Reported Normal 0-4 Bi-Rad, Self Reported Normal  CBC  Result Value Ref Range   WBC 5.4 4.0 - 10.5 K/uL   RBC 4.48 3.87 - 5.11 Mil/uL   Platelets 303.0 150.0 - 400.0 K/uL   Hemoglobin 14.2 12.0 - 15.0 g/dL   HCT 47.8 29.5 - 62.1 %   MCV 94.7 78.0 - 100.0 fl   MCHC 33.5 30.0 - 36.0 g/dL   RDW 30.8 65.7 - 84.6 %  Comprehensive metabolic panel  Result Value Ref Range   Sodium 138 135 - 145 mEq/L   Potassium 4.9 3.5 - 5.1 mEq/L   Chloride 103 96 - 112 mEq/L   CO2 27 19 - 32 mEq/L   Glucose, Bld 90 70 - 99 mg/dL   BUN 13 6 - 23 mg/dL   Creatinine, Ser 9.62 0.40 - 1.20 mg/dL   Total Bilirubin 1.2 0.2 - 1.2 mg/dL   Alkaline Phosphatase 95 39 - 117 U/L   AST 18 0 - 37 U/L   ALT 19 0 - 35 U/L   Total Danielle Webster.0 - 8.3 g/dL  Albumin 4.3 3.5 - 5.2 g/dL   GFR 16.10 >96.04 mL/min   Calcium 9.8 8.4 - 10.5 mg/dL  Lipid panel  Result Value Ref Range   Cholesterol 168 0 - 200 mg/dL   Triglycerides 54.0 0.0 - 149.0 mg/dL   HDL 98.11 >91.47 mg/dL   VLDL 82.9 0.0 - 56.2 mg/dL   LDL Cholesterol 98 0 - 99 mg/dL   Total CHOL/HDL Ratio 3    NonHDL 109.52       The 10-year ASCVD risk score (Arnett DK, et al., 2019) is: 8.6%    Assessment & Plan:   Problem List Items Addressed This Visit       Cardiovascular and Mediastinum   Primary hypertension - Primary    Chronic, stable. Continue lisinopril  daily. Check CMP, CBC, lipid panel today. Refill sent to the pharmacy. Follow-up in 3 months for welcome to medicare visit.       Relevant Medications   lisinopril (ZESTRIL) 5 MG tablet   Other Relevant Orders   CBC (Completed)   Comprehensive metabolic panel (Completed)   Lipid panel (Completed)   Other Visit Diagnoses     Laceration of left upper extremity, initial encounter       Laceration to left forearm healing well, no signs of infection. Will update Td today.   Relevant  Orders   Td vaccine greater than or equal to 7yo preservative free IM (Completed)       Return in about 3 months (around 10/20/2022) for welcome to medicare.    Gerre Scull, NP

## 2022-07-21 NOTE — Assessment & Plan Note (Signed)
Chronic, stable. Continue lisinopril  daily. Check CMP, CBC, lipid panel today. Refill sent to the pharmacy. Follow-up in 3 months for welcome to medicare visit.

## 2022-07-21 NOTE — Patient Instructions (Signed)
It was great to see you!  We are checking your labs today and will let you know the results via mychart/phone.   Let's follow-up in 2-3 months, sooner if you have concerns.  If a referral was placed today, you will be contacted for an appointment. Please note that routine referrals can sometimes take up to 3-4 weeks to process. Please call our office if you haven't heard anything after this time frame.  Take care,  Merl Guardino, NP  

## 2023-01-09 LAB — HM DEXA SCAN

## 2023-01-16 ENCOUNTER — Ambulatory Visit (INDEPENDENT_AMBULATORY_CARE_PROVIDER_SITE_OTHER): Payer: Medicare Other

## 2023-01-16 DIAGNOSIS — Z0000 Encounter for general adult medical examination without abnormal findings: Secondary | ICD-10-CM | POA: Diagnosis not present

## 2023-01-16 NOTE — Patient Instructions (Signed)
Danielle Webster , Thank you for taking time to come for your Medicare Wellness Visit. I appreciate your ongoing commitment to your health goals. Please review the following plan we discussed and let me know if I can assist you in the future.   Referrals/Orders/Follow-Ups/Clinician Recommendations: none  This is a list of the screening recommended for you and due dates:  Health Maintenance  Topic Date Due   Hepatitis C Screening  Never done   Pneumonia Vaccine (1 of 1 - PCV) 04/04/2020   Flu Shot  10/27/2022   Zoster (Shingles) Vaccine (1 of 2) 04/18/2023*   Colon Cancer Screening  11/23/2023   Mammogram  01/05/2024   Medicare Annual Wellness Visit  01/16/2024   DTaP/Tdap/Td vaccine (2 - Tdap) 07/20/2032   DEXA scan (bone density measurement)  Completed   HPV Vaccine  Aged Out   COVID-19 Vaccine  Discontinued  *Topic was postponed. The date shown is not the original due date.    Advanced directives: (ACP Link)Information on Advanced Care Planning can be found at [facility] [place] Advance Health Care Directives Advance Health Care Directives ([email or web address]   Next Medicare Annual Wellness Visit scheduled for next year: Yes  Insert Preventive Care attachment Insert FALL PREVENTION attachment if needed

## 2023-01-16 NOTE — Progress Notes (Signed)
Subjective:   Danielle Webster is a 67 y.o. female who presents for an Initial Medicare Annual Wellness Visit.  Visit Complete: Virtual I connected with  Danielle Webster on 01/16/23 by a audio enabled telemedicine application and verified that I am speaking with the correct person using two identifiers.  Patient Location: Home  Provider Location: Office/Clinic  I discussed the limitations of evaluation and management by telemedicine. The patient expressed understanding and agreed to proceed.  Vital Signs: Because this visit was a virtual/telehealth visit, some criteria may be missing or patient reported. Any vitals not documented were not able to be obtained and vitals that have been documented are patient reported.  Patient Medicare AWV questionnaire was completed by the patient on 01/15/2023; I have confirmed that all information answered by patient is correct and no changes since this date.  Cardiac Risk Factors include: advanced age (>57men, >78 women);hypertension     Objective:    Today's Vitals   There is no height or weight on file to calculate BMI.     01/16/2023    2:00 PM 10/31/2018    9:29 AM  Advanced Directives  Does Patient Have a Medical Advance Directive? No No  Would patient like information on creating a medical advance directive?  Yes (MAU/Ambulatory/Procedural Areas - Information given)    Current Medications (verified) Outpatient Encounter Medications as of 01/16/2023  Medication Sig   lisinopril (ZESTRIL) 5 MG tablet Take 1 tablet (5 mg total) by mouth daily.   No facility-administered encounter medications on file as of 01/16/2023.    Allergies (verified) Patient has no known allergies.   History: History reviewed. No pertinent past medical history. Past Surgical History:  Procedure Laterality Date   JOINT REPLACEMENT Right 01/03/2020   KNEE SURGERY Right 1979   left knee surgery Left    NECK SURGERY  1993   ruptureed disc on neck   WRIST  SURGERY Left 07/2018   Family History  Problem Relation Age of Onset   Heart disease Mother 46       enlarged heart   Cancer Father 52       lung cancer secondary to tobacco use   AAA (abdominal aortic aneurysm) Father    Heart disease Father 49       CAD with CABG   Diabetes Maternal Grandfather    Cancer Paternal Grandfather    Social History   Socioeconomic History   Marital status: Divorced    Spouse name: Not on file   Number of children: Not on file   Years of education: Not on file   Highest education level: Some college, no degree  Occupational History    Comment: Leisure centre manager  Tobacco Use   Smoking status: Never   Smokeless tobacco: Never  Vaping Use   Vaping status: Never Used  Substance and Sexual Activity   Alcohol use: Yes    Comment: social   Drug use: Never   Sexual activity: Not Currently  Other Topics Concern   Not on file  Social History Narrative   Not on file   Social Determinants of Health   Financial Resource Strain: Low Risk  (01/15/2023)   Overall Financial Resource Strain (CARDIA)    Difficulty of Paying Living Expenses: Not hard at all  Food Insecurity: No Food Insecurity (01/15/2023)   Hunger Vital Sign    Worried About Running Out of Food in the Last Year: Never true    Ran Out of Food in the  Last Year: Never true  Transportation Needs: No Transportation Needs (01/15/2023)   PRAPARE - Administrator, Civil Service (Medical): No    Lack of Transportation (Non-Medical): No  Physical Activity: Patient Declined (01/15/2023)   Exercise Vital Sign    Days of Exercise per Week: Patient declined    Minutes of Exercise per Session: Patient declined  Stress: No Stress Concern Present (01/15/2023)   [employer] of Occupational Health - Occupational Stress Questionnaire    Feeling of Stress : Not at all  Social Connections: Unknown (01/15/2023)   Social Connection and Isolation Panel [NHANES]    Frequency of Communication  with Friends and Family: Patient declined    Frequency of Social Gatherings with Friends and Family: Patient declined    Attends Religious Services: Patient declined    Database administrator or Organizations: Yes    Attends Engineer, structural: More than 4 times per year    Marital Status: Divorced    Tobacco Counseling Counseling given: Not Answered   Clinical Intake:  Pre-visit preparation completed: Yes  Pain : No/denies pain     Nutritional Risks: None Diabetes: No  How often do you need to have someone help you when you read instructions, pamphlets, or other written materials from your doctor or pharmacy?: 1 - Never  Interpreter Needed?: No  Information entered by :: NAllen LPN   Activities of Daily Living    01/15/2023    7:00 PM  In your present state of health, do you have any difficulty performing the following activities:  Hearing? 0  Vision? 0  Difficulty concentrating or making decisions? 0  Walking or climbing stairs? 0  Dressing or bathing? 0  Doing errands, shopping? 0  Preparing Food and eating ? N  Using the Toilet? N  In the past six months, have you accidently leaked urine? N  Do you have problems with loss of bowel control? N  Managing your Medications? N  Managing your Finances? N  Housekeeping or managing your Housekeeping? N    Patient Care Team: Gerre Scull, NP as PCP - General (Internal Medicine)  Indicate any recent Medical Services you may have received from other than [facility] providers in the past year (date may be approximate).     Assessment:   This is a routine wellness examination for Danielle Webster.  Hearing/Vision screen Hearing Screening - Comments:: Denies hearing issues Vision Screening - Comments:: Regular eye exams, [facility]   Goals Addressed             This Visit's Progress    Patient Stated       01/16/2023, stay more active       Depression Screen    01/16/2023    2:00 PM 07/21/2022     8:51 AM 08/26/2021    8:19 AM 11/15/2019    8:49 AM 10/31/2018    8:45 AM  PHQ 2/9 Scores  PHQ - 2 Score 0 0 0 0 0  PHQ- 9 Score 0 0       Fall Risk    01/15/2023    7:00 PM 07/21/2022    8:50 AM 08/26/2021    8:03 AM 07/28/2021    8:28 AM 10/31/2018    8:44 AM  Fall Risk   Falls in the past year? 0 0 0 0 0  Number falls in past yr:  0 0 0   Injury with Fall? 0 0 0 0   Risk for  fall due to : Medication side effect No Fall Risks  No Fall Risks   Follow up Falls prevention discussed;Falls evaluation completed Falls evaluation completed       MEDICARE RISK AT HOME: Medicare Risk at Home Any stairs in or around the home?: Yes If so, are there any without handrails?: No Home free of loose throw rugs in walkways, pet beds, electrical cords, etc?: Yes Adequate lighting in your home to reduce risk of falls?: Yes Life alert?: No Use of a cane, walker or w/c?: No Grab bars in the bathroom?: No Shower chair or bench in shower?: No Elevated toilet seat or a handicapped toilet?: No  TIMED UP AND GO:  Was the test performed? No    Cognitive Function:        01/16/2023    2:01 PM  6CIT Screen  What Year? 0 points  What month? 0 points  What time? 0 points  Count back from 20 0 points  Months in reverse 0 points  Repeat phrase 0 points  Total Score 0 points    Immunizations Immunization History  Administered Date(s) Administered   Influenza, Quadrivalent, Recombinant, Inj, Pf 01/31/2013   Influenza-Unspecified 12/25/2018, 01/08/2021   PFIZER(Purple Top)SARS-COV-2 Vaccination 06/11/2019, 07/02/2019   Pneumococcal-Unspecified 01/08/2021   Td 07/21/2022   Unspecified SARS-COV-2 Vaccination 03/26/2020    TDAP status: Up to date  Flu Vaccine status: Due, Education has been provided regarding the importance of this vaccine. Advised may receive this vaccine at local pharmacy or Health Dept. Aware to provide a copy of the vaccination record if obtained from local pharmacy or Health  Dept. Verbalized acceptance and understanding.  Pneumococcal vaccine status: Due, Education has been provided regarding the importance of this vaccine. Advised may receive this vaccine at local pharmacy or Health Dept. Aware to provide a copy of the vaccination record if obtained from local pharmacy or Health Dept. Verbalized acceptance and understanding.  Covid-19 vaccine status: Information provided on how to obtain vaccines.   Qualifies for Shingles Vaccine? Yes   Zostavax completed No   Shingrix Completed?: No.    Education has been provided regarding the importance of this vaccine. Patient has been advised to call insurance company to determine out of pocket expense if they have not yet received this vaccine. Advised may also receive vaccine at local pharmacy or Health Dept. Verbalized acceptance and understanding.  Screening Tests Health Maintenance  Topic Date Due   Hepatitis C Screening  Never done   Pneumonia Vaccine 47+ Years old (1 of 1 - PCV) 04/04/2020   INFLUENZA VACCINE  10/27/2022   Zoster Vaccines- Shingrix (1 of 2) 04/18/2023 (Originally 04/04/2005)   Colonoscopy  11/23/2023   MAMMOGRAM  01/05/2024   Medicare Annual Wellness (AWV)  01/16/2024   DTaP/Tdap/Td (2 - Tdap) 07/20/2032   DEXA SCAN  Completed   HPV VACCINES  Aged Out   COVID-19 Vaccine  Discontinued    Health Maintenance  Health Maintenance Due  Topic Date Due   Hepatitis C Screening  Never done   Pneumonia Vaccine 82+ Years old (1 of 1 - PCV) 04/04/2020   INFLUENZA VACCINE  10/27/2022    Colorectal cancer screening: Type of screening: Colonoscopy. Completed 11/22/2013. Repeat every 10 years  Mammogram status: Completed 01/04/2022. Repeat every year  Bone Density status: Completed 01/07/2021.   Lung Cancer Screening: (Low Dose CT Chest recommended if Age 67-80 years, 20 pack-year currently smoking OR have quit w/in 15years.) does not qualify.   Lung Cancer Screening  Referral: no  Additional  Screening:  Hepatitis C Screening: does qualify;   Vision Screening: Recommended annual ophthalmology exams for early detection of glaucoma and other disorders of the eye. Is the patient up to date with their annual eye exam?  Yes  Who is the provider or what is the name of the office in which the patient attends annual eye exams? [facility] If pt is not established with a provider, would they like to be referred to a provider to establish care? No .   Dental Screening: Recommended annual dental exams for proper oral hygiene  Diabetic Foot Exam: n/a  Community Resource Referral / Chronic Care Management: CRR required this visit?  No   CCM required this visit?  No     Plan:     I have personally reviewed and noted the following in the patient's chart:   Medical and social history Use of alcohol, tobacco or illicit drugs  Current medications and supplements including opioid prescriptions. Patient is not currently taking opioid prescriptions. Functional ability and status Nutritional status Physical activity Advanced directives List of other physicians Hospitalizations, surgeries, and ER visits in previous 12 months Vitals Screenings to include cognitive, depression, and falls Referrals and appointments  In addition, I have reviewed and discussed with patient certain preventive protocols, quality metrics, and best practice recommendations. A written personalized care plan for preventive services as well as general preventive health recommendations were provided to patient.     Barb Merino, LPN   16/12/9602   After Visit Summary: (MyChart) Due to this being a telephonic visit, the after visit summary with patients personalized plan was offered to patient via MyChart   Nurse Notes: none

## 2023-02-13 LAB — HM MAMMOGRAPHY

## 2023-08-04 ENCOUNTER — Encounter: Payer: Self-pay | Admitting: Nurse Practitioner

## 2023-08-04 ENCOUNTER — Ambulatory Visit (INDEPENDENT_AMBULATORY_CARE_PROVIDER_SITE_OTHER): Admitting: Nurse Practitioner

## 2023-08-04 VITALS — BP 124/88 | HR 69 | Temp 96.5°F | Ht 67.0 in | Wt 194.6 lb

## 2023-08-04 DIAGNOSIS — Z1211 Encounter for screening for malignant neoplasm of colon: Secondary | ICD-10-CM | POA: Diagnosis not present

## 2023-08-04 DIAGNOSIS — Z6830 Body mass index (BMI) 30.0-30.9, adult: Secondary | ICD-10-CM

## 2023-08-04 DIAGNOSIS — I10 Essential (primary) hypertension: Secondary | ICD-10-CM

## 2023-08-04 DIAGNOSIS — E669 Obesity, unspecified: Secondary | ICD-10-CM

## 2023-08-04 LAB — LIPID PANEL
Cholesterol: 186 mg/dL (ref 0–200)
HDL: 57.3 mg/dL (ref >39.00)
LDL Cholesterol: 116 mg/dL — ABNORMAL HIGH (ref 0–99)
NonHDL: 128.21
Total CHOL/HDL Ratio: 3
Triglycerides: 63 mg/dL (ref 0.0–149.0)
VLDL: 12.6 mg/dL (ref 0.0–40.0)

## 2023-08-04 LAB — CBC WITH DIFFERENTIAL/PLATELET
Basophils Absolute: 0.1 10*3/uL (ref 0.0–0.1)
Basophils Relative: 1.2 % (ref 0.0–3.0)
Eosinophils Absolute: 0.1 10*3/uL (ref 0.0–0.7)
Eosinophils Relative: 1.8 % (ref 0.0–5.0)
HCT: 42.4 % (ref 36.0–46.0)
Hemoglobin: 14.2 g/dL (ref 12.0–15.0)
Lymphocytes Relative: 38.6 % (ref 12.0–46.0)
Lymphs Abs: 2.1 10*3/uL (ref 0.7–4.0)
MCHC: 33.4 g/dL (ref 30.0–36.0)
MCV: 95.5 fl (ref 78.0–100.0)
Monocytes Absolute: 0.5 10*3/uL (ref 0.1–1.0)
Monocytes Relative: 8.8 % (ref 3.0–12.0)
Neutro Abs: 2.7 10*3/uL (ref 1.4–7.7)
Neutrophils Relative %: 49.6 % (ref 43.0–77.0)
Platelets: 306 10*3/uL (ref 150.0–400.0)
RBC: 4.44 Mil/uL (ref 3.87–5.11)
RDW: 12.6 % (ref 11.5–15.5)
WBC: 5.5 10*3/uL (ref 4.0–10.5)

## 2023-08-04 LAB — COMPREHENSIVE METABOLIC PANEL WITH GFR
ALT: 14 U/L (ref 0–35)
AST: 16 U/L (ref 0–37)
Albumin: 4.3 g/dL (ref 3.5–5.2)
Alkaline Phosphatase: 86 U/L (ref 39–117)
BUN: 13 mg/dL (ref 6–23)
CO2: 27 meq/L (ref 19–32)
Calcium: 9.4 mg/dL (ref 8.4–10.5)
Chloride: 104 meq/L (ref 96–112)
Creatinine, Ser: 0.62 mg/dL (ref 0.40–1.20)
GFR: 91.56 mL/min (ref >60.00)
Glucose, Bld: 88 mg/dL (ref 70–99)
Potassium: 3.9 meq/L (ref 3.5–5.1)
Sodium: 139 meq/L (ref 135–145)
Total Bilirubin: 0.7 mg/dL (ref 0.2–1.2)
Total Protein: 7.1 g/dL (ref 6.0–8.3)

## 2023-08-04 NOTE — Progress Notes (Signed)
 BP 124/88 (BP Location: Left Arm, Patient Position: Sitting, Cuff Size: Normal)   Pulse 69   Temp (!) 96.5 F (35.8 C)   Ht 5\' 7"  (1.702 m)   Wt 194 lb 9.6 oz (88.3 kg)   SpO2 98%   BMI 30.48 kg/m    Subjective:    Patient ID: Danielle Webster, female    DOB: 26-Mar-1956, 68 y.o.   MRN: [id number]  CC: Chief Complaint  Patient presents with   Hypertension    Follow up    HPI: Danielle Webster is a 68 y.o. female presenting on 08/04/2023 for comprehensive medical examination. Current medical complaints include:none  She currently lives with: alone Menopausal Symptoms: no  Depression and Anxiety Screen done today and results listed below:     08/04/2023    8:24 AM 01/16/2023    2:00 PM 07/21/2022    8:51 AM 08/26/2021    8:19 AM 11/15/2019    8:49 AM  Depression screen PHQ 2/9  Decreased Interest 0 0 0 0 0  Down, Depressed, Hopeless 0 0 0 0 0  PHQ - 2 Score 0 0 0 0 0  Altered sleeping 0 0 0    Tired, decreased energy 0 0 0    Change in appetite 0 0 0    Feeling bad or failure about yourself  0 0 0    Trouble concentrating 0 0 0    Moving slowly or fidgety/restless 0 0 0    Suicidal thoughts 0 0 0    PHQ-9 Score 0 0 0    Difficult doing work/chores Not difficult at all Not difficult at all Not difficult at all        08/04/2023    8:25 AM 07/21/2022    8:52 AM  GAD 7 : Generalized Anxiety Score  Nervous, Anxious, on Edge 0 0  Control/stop worrying 0 0  Worry too much - different things 0 0  Trouble relaxing 0 0  Restless 0 0  Easily annoyed or irritable 0 0  Afraid - awful might happen 0 0  Total GAD 7 Score 0 0  Anxiety Difficulty Not difficult at all Not difficult at all    The patient does not have a history of falls. I did not complete a risk assessment for falls. A plan of care for falls was not documented.   Past Medical History:  History reviewed. No pertinent past medical history.  Surgical History:  Past Surgical History:  Procedure Laterality Date    JOINT REPLACEMENT Right 01/03/2020   KNEE SURGERY Right 1979   left knee surgery Left    NECK SURGERY  1993   ruptureed disc on neck   WRIST SURGERY Left 07/2018    Medications:  Current Outpatient Medications on File Prior to Visit  Medication Sig   lisinopril  (ZESTRIL ) 5 MG tablet Take 1 tablet (5 mg total) by mouth daily.   No current facility-administered medications on file prior to visit.    Allergies:  No Known Allergies  Social History:  Social History   Socioeconomic History   Marital status: Divorced    Spouse name: Not on file   Number of children: Not on file   Years of education: Not on file   Highest education level: Some college, no degree  Occupational History    Comment: Leisure centre manager  Tobacco Use   Smoking status: Never   Smokeless tobacco: Never  Vaping Use   Vaping status: Never Used  Substance and Sexual Activity   Alcohol use: Yes    Comment: social   Drug use: Never   Sexual activity: Not Currently  Other Topics Concern   Not on file  Social History Narrative   Not on file   Social Drivers of Health   Financial Resource Strain: Low Risk  (08/02/2023)   Overall Financial Resource Strain (CARDIA)    Difficulty of Paying Living Expenses: Not hard at all  Food Insecurity: No Food Insecurity (08/02/2023)   Hunger Vital Sign    Worried About Running Out of Food in the Last Year: Never true    Ran Out of Food in the Last Year: Never true  Transportation Needs: No Transportation Needs (08/02/2023)   PRAPARE - Administrator, Civil Service (Medical): No    Lack of Transportation (Non-Medical): No  Physical Activity: Unknown (08/02/2023)   Exercise Vital Sign    Days of Exercise per Week: 0 days    Minutes of Exercise per Session: Patient declined  Stress: No Stress Concern Present (08/02/2023)   [employer] of Occupational Health - Occupational Stress Questionnaire    Feeling of Stress : Not at all  Social Connections: Unknown  (08/02/2023)   Social Connection and Isolation Panel [NHANES]    Frequency of Communication with Friends and Family: More than three times a week    Frequency of Social Gatherings with Friends and Family: Twice a week    Attends Religious Services: Patient declined    Database administrator or Organizations: Yes    Attends Engineer, structural: More than 4 times per year    Marital Status: Divorced  Intimate Partner Violence: Not At Risk (01/16/2023)   Humiliation, Afraid, Rape, and Kick questionnaire    Fear of Current or Ex-Partner: No    Emotionally Abused: No    Physically Abused: No    Sexually Abused: No   Social History   Tobacco Use  Smoking Status Never  Smokeless Tobacco Never   Social History   Substance and Sexual Activity  Alcohol Use Yes   Comment: social    Family History:  Family History  Problem Relation Age of Onset   Heart disease Mother 75       enlarged heart   Cancer Father 64       lung cancer secondary to tobacco use   AAA (abdominal aortic aneurysm) Father    Heart disease Father 72       CAD with CABG   Diabetes Maternal Grandfather    Cancer Paternal Grandfather     Past medical history, surgical history, medications, allergies, family history and social history reviewed with patient today and changes made to appropriate areas of the chart.   Review of Systems  Constitutional: Negative.   HENT: Negative.    Eyes: Negative.   Respiratory: Negative.    Cardiovascular: Negative.   Gastrointestinal: Negative.   Genitourinary: Negative.   Musculoskeletal: Negative.   Skin: Negative.   Neurological: Negative.   Psychiatric/Behavioral: Negative.     All other ROS negative except what is listed above and in the HPI.      Objective:     BP 124/88 (BP Location: Left Arm, Patient Position: Sitting, Cuff Size: Normal)   Pulse 69   Temp (!) 96.5 F (35.8 C)   Ht 5\' 7"  (1.702 m)   Wt 194 lb 9.6 oz (88.3 kg)   SpO2 98%   BMI  30.48 kg/m   Wt  Readings from Last 3 Encounters:  08/04/23 194 lb 9.6 oz (88.3 kg)  07/21/22 200 lb (90.7 kg)  12/29/21 200 lb 3.2 oz (90.8 kg)    Physical Exam Vitals and nursing note reviewed.  Constitutional:      General: She is not in acute distress.    Appearance: Normal appearance.  HENT:     Head: Normocephalic and atraumatic.     Right Ear: Tympanic membrane, ear canal and external ear normal.     Left Ear: Tympanic membrane, ear canal and external ear normal.     Mouth/Throat:     Mouth: Mucous membranes are moist.     Pharynx: No posterior oropharyngeal erythema.  Eyes:     Conjunctiva/sclera: Conjunctivae normal.  Cardiovascular:     Rate and Rhythm: Normal rate and regular rhythm.     Pulses: Normal pulses.     Heart sounds: Normal heart sounds.  Pulmonary:     Effort: Pulmonary effort is normal.     Breath sounds: Normal breath sounds.  Abdominal:     Palpations: Abdomen is soft.     Tenderness: There is no abdominal tenderness.  Musculoskeletal:        General: Normal range of motion.     Cervical back: Normal range of motion and neck supple.     Right lower leg: No edema.     Left lower leg: No edema.  Lymphadenopathy:     Cervical: No cervical adenopathy.  Skin:    General: Skin is warm and dry.  Neurological:     General: No focal deficit present.     Mental Status: She is alert and oriented to person, place, and time.     Cranial Nerves: No cranial nerve deficit.     Coordination: Coordination normal.     Gait: Gait normal.  Psychiatric:        Mood and Affect: Mood normal.        Behavior: Behavior normal.        Thought Content: Thought content normal.        Judgment: Judgment normal.     Results for orders placed or performed in visit on 07/21/22  HM MAMMOGRAPHY   Collection Time: 01/04/22 12:00 AM  Result Value Ref Range   HM Mammogram Self Reported Normal 0-4 Bi-Rad, Self Reported Normal  CBC   Collection Time: 07/21/22  9:26 AM   Result Value Ref Range   WBC 5.4 4.0 - 10.5 K/uL   RBC 4.48 3.87 - 5.11 Mil/uL   Platelets 303.0 150.0 - 400.0 K/uL   Hemoglobin 14.2 12.0 - 15.0 g/dL   HCT 16.1 09.6 - 04.5 %   MCV 94.7 78.0 - 100.0 fl   MCHC 33.5 30.0 - 36.0 g/dL   RDW 40.9 81.1 - 91.4 %  Comprehensive metabolic panel   Collection Time: 07/21/22  9:26 AM  Result Value Ref Range   Sodium 138 135 - 145 mEq/L   Potassium 4.9 3.5 - 5.1 mEq/L   Chloride 103 96 - 112 mEq/L   CO2 27 19 - 32 mEq/L   Glucose, Bld 90 70 - 99 mg/dL   BUN 13 6 - 23 mg/dL   Creatinine, Ser 7.82 0.40 - 1.20 mg/dL   Total Bilirubin 1.2 0.2 - 1.2 mg/dL   Alkaline Phosphatase 95 39 - 117 U/L   AST 18 0 - 37 U/L   ALT 19 0 - 35 U/L   Total Protein 7.1 6.0 - 8.3 g/dL  Albumin 4.3 3.5 - 5.2 g/dL   GFR 56.21 >30.86 mL/min   Calcium 9.8 8.4 - 10.5 mg/dL  Lipid panel   Collection Time: 07/21/22  9:26 AM  Result Value Ref Range   Cholesterol 168 0 - 200 mg/dL   Triglycerides 57.8 0.0 - 149.0 mg/dL   HDL 46.96 >29.52 mg/dL   VLDL 84.1 0.0 - 32.4 mg/dL   LDL Cholesterol 98 0 - 99 mg/dL   Total CHOL/HDL Ratio 3    NonHDL 109.52       Assessment & Plan:   Problem List Items Addressed This Visit       Cardiovascular and Mediastinum   Primary hypertension - Primary   Chronic, stable. Continue lisinopril  5mg  daily. Check CMP, CBC, lipid panel today. Follow-up in 1 year.       Relevant Orders   CBC with Differential/Platelet   Comprehensive metabolic panel with GFR   Lipid panel     Other   Obesity (BMI 30-39.9)   BMI 30.4. Continue focus on nutrition and exercise.       Other Visit Diagnoses       Screen for colon cancer       Cologuard ordered today   Relevant Orders   Cologuard        Follow up plan: Return in about 1 year (around 08/03/2024) for CPE.   LABORATORY TESTING:  - Pap smear: not applicable  IMMUNIZATIONS:   - Tdap: Tetanus vaccination status reviewed: last tetanus booster within 10 years. - Influenza:  Given elsewhere - Pneumovax: Up to date - Prevnar: Up to date - HPV: Not applicable - Shingrix vaccine: Declined  SCREENING: -Mammogram: Done elsewhere  - Colonoscopy: Up to date  - Bone Density: Done elsewhere   PATIENT COUNSELING:   Advised to take 1 mg of folate supplement per day if capable of pregnancy.   Sexuality: Discussed sexually transmitted diseases, partner selection, use of condoms, avoidance of unintended pregnancy  and contraceptive alternatives.   Advised to avoid cigarette smoking.  I discussed with the patient that most people either abstain from alcohol or drink within safe limits (<=14/week and <=4 drinks/occasion for males, <=7/weeks and <= 3 drinks/occasion for females) and that the risk for alcohol disorders and other health effects rises proportionally with the number of drinks per week and how often a drinker exceeds daily limits.  Discussed cessation/primary prevention of drug use and availability of treatment for abuse.   Diet: Encouraged to adjust caloric intake to maintain  or achieve ideal body weight, to reduce intake of dietary saturated fat and total fat, to limit sodium intake by avoiding high sodium foods and not adding table salt, and to maintain adequate dietary potassium and calcium preferably from fresh fruits, vegetables, and low-fat dairy products.    stressed the importance of regular exercise  Injury prevention: Discussed safety belts, safety helmets, smoke detector, smoking near bedding or upholstery.   Dental health: Discussed importance of regular tooth brushing, flossing, and dental visits.    NEXT PREVENTATIVE PHYSICAL DUE IN 1 YEAR. Return in about 1 year (around 08/03/2024) for CPE.  Gianne Shugars A Hughey Rittenberry

## 2023-08-04 NOTE — Assessment & Plan Note (Signed)
 Chronic, stable. Continue lisinopril  5mg  daily. Check CMP, CBC, lipid panel today. Follow-up in 1 year.

## 2023-08-04 NOTE — Assessment & Plan Note (Signed)
 BMI 30.4. Continue focus on nutrition and exercise.

## 2023-08-04 NOTE — Patient Instructions (Signed)
It was great to see you!  We are checking your labs today and will let you know the results via mychart/phone.   Keep up the great work!   Let's follow-up in 1 year, sooner if you have concerns.  If a referral was placed today, you will be contacted for an appointment. Please note that routine referrals can sometimes take up to 3-4 weeks to process. Please call our office if you haven't heard anything after this time frame.  Take care,  Nivin Braniff, NP  

## 2023-08-07 ENCOUNTER — Encounter: Payer: Self-pay | Admitting: Nurse Practitioner

## 2023-08-08 ENCOUNTER — Encounter: Payer: Self-pay | Admitting: Nurse Practitioner

## 2023-10-06 ENCOUNTER — Other Ambulatory Visit: Payer: Self-pay | Admitting: Nurse Practitioner

## 2023-10-06 NOTE — Telephone Encounter (Signed)
 Requesting: LISINOPRIL  5MG  TABLETS  Last Visit: 08/04/2023 Next Visit: Visit date not found Last Refill: 07/21/2022  Please Advise

## 2023-10-27 LAB — COLOGUARD: COLOGUARD: NEGATIVE

## 2023-10-30 ENCOUNTER — Ambulatory Visit: Payer: Self-pay | Admitting: Nurse Practitioner

## 2024-02-16 ENCOUNTER — Ambulatory Visit

## 2024-02-16 VITALS — BP 130/80 | HR 82 | Temp 98.2°F | Ht 66.25 in | Wt 201.8 lb

## 2024-02-16 DIAGNOSIS — Z0000 Encounter for general adult medical examination without abnormal findings: Secondary | ICD-10-CM

## 2024-02-16 NOTE — Progress Notes (Addendum)
 Chief Complaint  Patient presents with   Medicare Wellness     Subjective:   Danielle Webster is a 68 y.o. female who presents for a Medicare Annual Wellness Visit.  Allergies (verified) Patient has no known allergies.   History: History reviewed. No pertinent past medical history. Past Surgical History:  Procedure Laterality Date   JOINT REPLACEMENT Right 01/03/2020   KNEE SURGERY Right 1979   left knee surgery Left    NECK SURGERY  1993   ruptureed disc on neck   WRIST SURGERY Left 07/2018   Family History  Problem Relation Age of Onset   Heart disease Mother 62       enlarged heart   Cancer Father 81       lung cancer secondary to tobacco use   AAA (abdominal aortic aneurysm) Father    Heart disease Father 5       CAD with CABG   Diabetes Maternal Grandfather    Cancer Paternal Grandfather    Social History   Occupational History    Comment: leisure centre manager  Tobacco Use   Smoking status: Never   Smokeless tobacco: Never  Vaping Use   Vaping status: Never Used  Substance and Sexual Activity   Alcohol use: Yes    Comment: social   Drug use: Never   Sexual activity: Not Currently   Tobacco Counseling Counseling given: Not Answered  SDOH Screenings   Food Insecurity: No Food Insecurity (02/15/2024)  Housing: Low Risk  (02/15/2024)  Transportation Needs: No Transportation Needs (02/15/2024)  Utilities: Not At Risk (02/16/2024)  Alcohol Screen: Low Risk  (02/15/2024)  Depression (PHQ2-9): Low Risk  (02/16/2024)  Financial Resource Strain: Low Risk  (02/15/2024)  Physical Activity: Insufficiently Active (02/15/2024)  Social Connections: Moderately Isolated (02/15/2024)  Stress: No Stress Concern Present (02/15/2024)  Tobacco Use: Low Risk  (02/16/2024)  Health Literacy: Adequate Health Literacy (02/16/2024)   See flowsheets for full screening details  Depression Screen PHQ 2 & 9 Depression Scale- Over the past 2 weeks, how often have you been  bothered by any of the following problems? Little interest or pleasure in doing things: 0 Feeling down, depressed, or hopeless (PHQ Adolescent also includes...irritable): 0 PHQ-2 Total Score: 0 Trouble falling or staying asleep, or sleeping too much: 0 Feeling tired or having little energy: 0 Poor appetite or overeating (PHQ Adolescent also includes...weight loss): 0 Feeling bad about yourself - or that you are a failure or have let yourself or your family down: 0 Trouble concentrating on things, such as reading the newspaper or watching television (PHQ Adolescent also includes...like school work): 0 Moving or speaking so slowly that other people could have noticed. Or the opposite - being so fidgety or restless that you have been moving around a lot more than usual: 0 Thoughts that you would be better off dead, or of hurting yourself in some way: 0 PHQ-9 Total Score: 0 If you checked off any problems, how difficult have these problems made it for you to do your work, take care of things at home, or get along with other people?: Not difficult at all  Depression Treatment Depression Interventions/Treatment : EYV7-0 Score <4 Follow-up Not Indicated     Goals Addressed             This Visit's Progress    Patient Stated       02/16/2024, denies goals       Visit info / Clinical Intake: Medicare Wellness Visit Type:: Subsequent  Annual Wellness Visit Persons participating in visit:: patient Medicare Wellness Visit Mode:: In-person (required for WTM) Information given by:: patient Interpreter Needed?: No Pre-visit prep was completed: yes AWV questionnaire completed by patient prior to visit?: yes Date:: 02/15/24 Living arrangements:: (!) lives alone Patient's Overall Health Status Rating: excellent Typical amount of pain: none Does pain affect daily life?: no Are you currently prescribed opioids?: no  Dietary Habits and Nutritional Risks How many meals a day?: 3 Eats fruit  and vegetables daily?: yes Most meals are obtained by: preparing own meals In the last 2 weeks, have you had any of the following?: none Diabetic:: no  Functional Status Activities of Daily Living (to include ambulation/medication): Independent Ambulation: Independent Medication Administration: Independent Home Management: Independent Manage your own finances?: yes Primary transportation is: driving Concerns about vision?: no *vision screening is required for WTM* Concerns about hearing?: no  Fall Screening Falls in the past year?: 0 Number of falls in past year: 0 Was there an injury with Fall?: 0 Fall Risk Category Calculator: 0 Patient Fall Risk Level: Low Fall Risk  Fall Risk Patient at Risk for Falls Due to: Medication side effect Fall risk Follow up: Falls prevention discussed; Falls evaluation completed  Home and Transportation Safety: All rugs have non-skid backing?: N/A, no rugs All stairs or steps have railings?: yes Grab bars in the bathtub or shower?: (!) no Have non-skid surface in bathtub or shower?: yes Good home lighting?: yes Regular seat belt use?: yes Hospital stays in the last year:: no  Cognitive Assessment Difficulty concentrating, remembering, or making decisions? : no Will 6CIT or Mini Cog be Completed: yes What year is it?: 0 points What month is it?: 0 points Give patient an address phrase to remember (5 components): [address] About what time is it?: 0 points Count backwards from 20 to 1: 0 points Say the months of the year in reverse: 0 points Repeat the address phrase from earlier: 0 points 6 CIT Score: 0 points  Advance Directives (For Healthcare) Does Patient Have a Medical Advance Directive?: No Would patient like information on creating a medical advance directive?: No - Patient declined  Reviewed/Updated  Reviewed/Updated: Reviewed All (Medical, Surgical, Family, Medications, Allergies, Care Teams, Patient Goals)         Objective:    Today's Vitals   02/16/24 1255  BP: 130/80  Pulse: 82  Temp: 98.2 F (36.8 C)  TempSrc: Oral  SpO2: 96%  Weight: 201 lb 12.8 oz (91.5 kg)  Height: 5' 6.25 (1.683 m)   Body mass index is 32.33 kg/m.  Current Medications (verified) Outpatient Encounter Medications as of 02/16/2024  Medication Sig   lisinopril  (ZESTRIL ) 5 MG tablet TAKE 1 TABLET(5 MG) BY MOUTH DAILY   No facility-administered encounter medications on file as of 02/16/2024.   Hearing/Vision screen Hearing Screening - Comments:: Denies hearing issues Vision Screening - Comments:: Regular eye exams, [facility] and Health Maintenance Health Maintenance  Topic Date Due   Hepatitis C Screening  Never done   Influenza Vaccine  10/27/2023   Mammogram  02/12/2025   Medicare Annual Wellness (AWV)  02/15/2025   Fecal DNA (Cologuard)  10/19/2026   DTaP/Tdap/Td (2 - Tdap) 07/20/2032   Pneumococcal Vaccine: 50+ Years  Completed   Bone Density Scan  Completed   Meningococcal B Vaccine  Aged Out   Colonoscopy  Discontinued   COVID-19 Vaccine  Discontinued   Zoster Vaccines- Shingrix  Discontinued  Assessment/Plan:  This is a routine wellness examination for Enes.  Patient Care Team: Nedra Tinnie LABOR, NP as PCP - General (Internal Medicine) [facility] [place], [facility]  I have personally reviewed and noted the following in the patient's chart:   Medical and social history Use of alcohol, tobacco or illicit drugs  Current medications and supplements including opioid prescriptions. Functional ability and status Nutritional status Physical activity Advanced directives List of other physicians Hospitalizations, surgeries, and ER visits in previous 12 months Vitals Screenings to include cognitive, depression, and falls Referrals and appointments  No orders of the defined types were placed in this encounter.  In addition, I have  reviewed and discussed with patient certain preventive protocols, quality metrics, and best practice recommendations. A written personalized care plan for preventive services as well as general preventive health recommendations were provided to patient.   Ardella FORBES Dawn, LPN   [id number]/7974   Return in 1 year (on 02/15/2025).  After Visit Summary: (In Person-Declined) Patient declined AVS at this time.  Nurse Notes: Patient states she will get flu vaccine when she gets completely over her cold. Due for Hep C screening

## 2024-02-16 NOTE — Patient Instructions (Signed)
 Danielle Webster,  Thank you for taking the time for your Medicare Wellness Visit. I appreciate your continued commitment to your health goals. Please review the care plan we discussed, and feel free to reach out if I can assist you further.  Please note that Annual Wellness Visits do not include a physical exam. Some assessments may be limited, especially if the visit was conducted virtually. If needed, we may recommend an in-person follow-up with your provider.  Ongoing Care Seeing your primary care provider every 3 to 6 months helps us  monitor your health and provide consistent, personalized care.   Referrals If a referral was made during today's visit and you haven't received any updates within two weeks, please contact the referred provider directly to check on the status.  Recommended Screenings:  Health Maintenance  Topic Date Due   Hepatitis C Screening  Never done   Flu Shot  10/27/2023   Medicare Annual Wellness Visit  01/16/2024   Breast Cancer Screening  02/12/2025   Cologuard (Stool DNA test)  10/19/2026   DTaP/Tdap/Td vaccine (2 - Tdap) 07/20/2032   Pneumococcal Vaccine for age over 62  Completed   Osteoporosis screening with Bone Density Scan  Completed   Meningitis B Vaccine  Aged Out   Colon Cancer Screening  Discontinued   COVID-19 Vaccine  Discontinued   Zoster (Shingles) Vaccine  Discontinued       02/16/2024   12:58 PM  Advanced Directives  Does Patient Have a Medical Advance Directive? No  Would patient like information on creating a medical advance directive? No - Patient declined    Vision: Annual vision screenings are recommended for early detection of glaucoma, cataracts, and diabetic retinopathy. These exams can also reveal signs of chronic conditions such as diabetes and high blood pressure.  Dental: Annual dental screenings help detect early signs of oral cancer, gum disease, and other conditions linked to overall health, including heart disease and  diabetes.  Please see the attached documents for additional preventive care recommendations.

## 2024-02-19 ENCOUNTER — Other Ambulatory Visit: Payer: Self-pay | Admitting: Obstetrics and Gynecology

## 2024-02-19 DIAGNOSIS — R928 Other abnormal and inconclusive findings on diagnostic imaging of breast: Secondary | ICD-10-CM

## 2024-02-26 ENCOUNTER — Ambulatory Visit
Admission: RE | Admit: 2024-02-26 | Discharge: 2024-02-26 | Disposition: A | Source: Ambulatory Visit | Attending: Obstetrics and Gynecology | Admitting: Obstetrics and Gynecology

## 2024-02-26 DIAGNOSIS — R928 Other abnormal and inconclusive findings on diagnostic imaging of breast: Secondary | ICD-10-CM

## 2025-02-24 ENCOUNTER — Ambulatory Visit
